# Patient Record
Sex: Female | Born: 1995 | Race: Black or African American | Hispanic: No | Marital: Single | Smoking: Never smoker
Health system: Southern US, Community
[De-identification: ages and names within clinical notes are randomized; demographics above are authoritative.]

## PROBLEM LIST (undated history)

## (undated) ENCOUNTER — Inpatient Hospital Stay (HOSPITAL_COMMUNITY): Payer: Self-pay

## (undated) ENCOUNTER — Inpatient Hospital Stay (HOSPITAL_COMMUNITY): Payer: Managed Care, Other (non HMO)

## (undated) DIAGNOSIS — J45909 Unspecified asthma, uncomplicated: Secondary | ICD-10-CM

## (undated) DIAGNOSIS — J189 Pneumonia, unspecified organism: Secondary | ICD-10-CM

## (undated) DIAGNOSIS — Z332 Encounter for elective termination of pregnancy: Secondary | ICD-10-CM

## (undated) HISTORY — PX: MULTIPLE TOOTH EXTRACTIONS: SHX2053

---

## 2015-04-08 HISTORY — PX: INDUCED ABORTION: SHX677

## 2017-04-30 ENCOUNTER — Ambulatory Visit (INDEPENDENT_AMBULATORY_CARE_PROVIDER_SITE_OTHER): Payer: Managed Care, Other (non HMO) | Admitting: Student

## 2017-04-30 ENCOUNTER — Encounter: Payer: Self-pay | Admitting: Student

## 2017-04-30 DIAGNOSIS — Z124 Encounter for screening for malignant neoplasm of cervix: Secondary | ICD-10-CM

## 2017-04-30 DIAGNOSIS — Z23 Encounter for immunization: Secondary | ICD-10-CM | POA: Diagnosis not present

## 2017-04-30 DIAGNOSIS — O210 Mild hyperemesis gravidarum: Secondary | ICD-10-CM

## 2017-04-30 DIAGNOSIS — Z3481 Encounter for supervision of other normal pregnancy, first trimester: Secondary | ICD-10-CM

## 2017-04-30 DIAGNOSIS — O0990 Supervision of high risk pregnancy, unspecified, unspecified trimester: Secondary | ICD-10-CM | POA: Insufficient documentation

## 2017-04-30 DIAGNOSIS — Z3480 Encounter for supervision of other normal pregnancy, unspecified trimester: Secondary | ICD-10-CM

## 2017-04-30 LAB — POCT URINALYSIS DIP (DEVICE)
Bilirubin Urine: NEGATIVE
Glucose, UA: NEGATIVE mg/dL
Hgb urine dipstick: NEGATIVE
Ketones, ur: NEGATIVE mg/dL
Leukocytes, UA: NEGATIVE
Nitrite: NEGATIVE
PH: 7.5 (ref 5.0–8.0)
Protein, ur: NEGATIVE mg/dL
Specific Gravity, Urine: 1.015 (ref 1.005–1.030)
Urobilinogen, UA: 0.2 mg/dL (ref 0.0–1.0)

## 2017-04-30 NOTE — Patient Instructions (Signed)

## 2017-05-01 DIAGNOSIS — O210 Mild hyperemesis gravidarum: Secondary | ICD-10-CM | POA: Insufficient documentation

## 2017-05-01 LAB — CYTOLOGY - PAP: Diagnosis: NEGATIVE

## 2017-05-01 MED ORDER — ONDANSETRON HCL 8 MG PO TABS: 8.0000 mg | ORAL_TABLET | Freq: Three times a day (TID) | ORAL | 1 refills | Status: DC | PRN

## 2017-05-01 NOTE — Progress Notes (Signed)
  Subjective:    Leslie Thomas is being seen today for her first obstetrical visit.  This is not a planned pregnancy. She is at 5039w1d gestation. Her obstetrical history is significant for nothing. . Relationship with FOB: significant other, not living together. Patient does not intend to breast feed. Pregnancy history fully reviewed.  Patient reports no complaints.  Review of Systems:   Review of Systems  Constitutional: Negative.   HENT: Negative.   Respiratory: Negative.   Cardiovascular: Negative.   Genitourinary: Negative.   Neurological: Negative.     Objective:     BP 114/61   Pulse 85   Ht 5' (1.524 m)   Wt 121 lb 3.2 oz (55 kg)   LMP 01/29/2017 (Within Days)   BMI 23.67 kg/m  Physical Exam  Constitutional: She is oriented to person, place, and time. She appears well-developed.  HENT:  Head: Normocephalic.  Eyes: Pupils are equal, round, and reactive to light.  Neck: Normal range of motion.  Respiratory: Effort normal.  GI: Soft.  Genitourinary: Vagina normal. No vaginal discharge found.  Neurological: She is alert and oriented to person, place, and time.  Skin: Skin is warm and dry.    Maternal Exam:  Introitus: Vagina is negative for discharge.       Assessment:    Pregnancy: G2P0010 Patient Active Problem List   Diagnosis Date Noted  . Supervision of other normal pregnancy, antepartum 04/30/2017       Plan:     Initial labs drawn. Prenatal vitamins. Problem list reviewed and updated. AFP3 discussed: declined. Role of ultrasound in pregnancy discussed; fetal survey: ordered. Amniocentesis discussed: not indicated. Follow up in 8 weeks. 50% of 30 min visit spent on counseling and coordination of care.  -Oriented patient to practice, students, residents -Reviewed warning signs and when to come to MAU.  -Pap smear today -Anatomy scan ordered  Leslie Thomas 05/01/2017

## 2017-05-02 LAB — URINE CULTURE, OB REFLEX

## 2017-05-02 LAB — CULTURE, OB URINE

## 2017-05-04 LAB — HEMOGLOBINOPATHY EVALUATION
Ferritin: 204 ng/mL — ABNORMAL HIGH (ref 15–150)
HGB A2 QUANT: 2.1 % (ref 1.8–3.2)
HGB A: 97.9 % (ref 96.4–98.8)
HGB C: 0 %
HGB F QUANT: 0 % (ref 0.0–2.0)
HGB S: 0 %
Hgb Solubility: NEGATIVE
Hgb Variant: 0 %

## 2017-05-04 LAB — OBSTETRIC PANEL, INCLUDING HIV
Antibody Screen: NEGATIVE
BASOS ABS: 0.1 10*3/uL (ref 0.0–0.2)
Basos: 0 %
EOS (ABSOLUTE): 0.4 10*3/uL (ref 0.0–0.4)
EOS: 2 %
HEMOGLOBIN: 12.2 g/dL (ref 11.1–15.9)
HEP B S AG: NEGATIVE
HIV Screen 4th Generation wRfx: NONREACTIVE
Hematocrit: 35.9 % (ref 34.0–46.6)
IMMATURE GRANULOCYTES: 0 %
Immature Grans (Abs): 0 10*3/uL (ref 0.0–0.1)
Lymphocytes Absolute: 2.6 10*3/uL (ref 0.7–3.1)
Lymphs: 15 %
MCH: 28.2 pg (ref 26.6–33.0)
MCHC: 34 g/dL (ref 31.5–35.7)
MCV: 83 fL (ref 79–97)
MONOCYTES: 6 %
Monocytes Absolute: 1 10*3/uL — ABNORMAL HIGH (ref 0.1–0.9)
NEUTROS ABS: 12.9 10*3/uL — AB (ref 1.4–7.0)
NEUTROS PCT: 77 %
PLATELETS: 335 10*3/uL (ref 150–379)
RBC: 4.33 x10E6/uL (ref 3.77–5.28)
RDW: 12.9 % (ref 12.3–15.4)
RH TYPE: POSITIVE
RPR: NONREACTIVE
RUBELLA: 3.84 {index} (ref >0.99)
WBC: 17 10*3/uL — ABNORMAL HIGH (ref 3.4–10.8)

## 2017-05-05 ENCOUNTER — Encounter: Payer: Self-pay | Admitting: *Deleted

## 2017-05-08 LAB — SMN1 COPY NUMBER ANALYSIS (SMA CARRIER SCREENING)

## 2017-05-31 DIAGNOSIS — Z3480 Encounter for supervision of other normal pregnancy, unspecified trimester: Secondary | ICD-10-CM

## 2017-06-11 ENCOUNTER — Other Ambulatory Visit: Payer: Self-pay | Admitting: Obstetrics and Gynecology

## 2017-06-11 ENCOUNTER — Other Ambulatory Visit: Payer: Self-pay | Admitting: Student

## 2017-06-11 ENCOUNTER — Encounter: Payer: Self-pay | Admitting: Student

## 2017-06-11 ENCOUNTER — Encounter (HOSPITAL_COMMUNITY): Payer: Self-pay | Admitting: *Deleted

## 2017-06-11 ENCOUNTER — Other Ambulatory Visit: Payer: Self-pay

## 2017-06-11 ENCOUNTER — Ambulatory Visit (HOSPITAL_COMMUNITY)
Admission: RE | Admit: 2017-06-11 | Discharge: 2017-06-11 | Disposition: A | Discharge status: Home / Self Care | Payer: Managed Care, Other (non HMO) | Source: Ambulatory Visit | Attending: Student | Admitting: Student

## 2017-06-11 ENCOUNTER — Telehealth: Payer: Self-pay | Admitting: Obstetrics and Gynecology

## 2017-06-11 DIAGNOSIS — O26872 Cervical shortening, second trimester: Secondary | ICD-10-CM | POA: Diagnosis not present

## 2017-06-11 DIAGNOSIS — Z363 Encounter for antenatal screening for malformations: Secondary | ICD-10-CM | POA: Diagnosis not present

## 2017-06-11 DIAGNOSIS — N883 Incompetence of cervix uteri: Secondary | ICD-10-CM | POA: Insufficient documentation

## 2017-06-11 DIAGNOSIS — Z3A19 19 weeks gestation of pregnancy: Secondary | ICD-10-CM

## 2017-06-11 DIAGNOSIS — Z369 Encounter for antenatal screening, unspecified: Secondary | ICD-10-CM

## 2017-06-11 DIAGNOSIS — Z3482 Encounter for supervision of other normal pregnancy, second trimester: Secondary | ICD-10-CM | POA: Insufficient documentation

## 2017-06-11 DIAGNOSIS — Z3480 Encounter for supervision of other normal pregnancy, unspecified trimester: Secondary | ICD-10-CM

## 2017-06-11 DIAGNOSIS — Z3A17 17 weeks gestation of pregnancy: Secondary | ICD-10-CM | POA: Diagnosis not present

## 2017-06-11 NOTE — Addendum Note (Signed)
Encounter addended by: Lenise ArenaBazemore, Betti Goodenow, RDMS on: 06/11/2017 4:33 PM  Actions taken: Imaging Exam ended

## 2017-06-11 NOTE — Telephone Encounter (Signed)
OB Note  Dr. Debroah LoopArnold or I don't feel comfortable placing the rescue cerclage but case d/w Dr. Shawnie PonsPratt, and she is willing to do it on 3/9. Posted with the OR for 0930 on 3/9. Pt [facility] and [facility] and to be NPO after midnight. Also advised patient for pelvic rest and MAU precautions given.  Cornelia Copaharlie Torrin Crihfield, Jr MD Attending Center for [facility] 06/11/2017 Time: [phone]

## 2017-06-12 ENCOUNTER — Encounter (HOSPITAL_COMMUNITY): Payer: Self-pay | Admitting: *Deleted

## 2017-06-12 ENCOUNTER — Other Ambulatory Visit: Payer: Self-pay

## 2017-06-13 ENCOUNTER — Inpatient Hospital Stay (HOSPITAL_COMMUNITY): Payer: Managed Care, Other (non HMO) | Admitting: Anesthesiology

## 2017-06-13 ENCOUNTER — Inpatient Hospital Stay (HOSPITAL_COMMUNITY)
Admission: RE | Admit: 2017-06-13 | Payer: Managed Care, Other (non HMO) | Source: Ambulatory Visit | Admitting: Family Medicine

## 2017-06-13 ENCOUNTER — Ambulatory Visit (HOSPITAL_COMMUNITY)
Admission: AD | Admit: 2017-06-13 | Discharge: 2017-06-13 | Disposition: A | Discharge status: Home / Self Care | Payer: Managed Care, Other (non HMO) | Source: Ambulatory Visit | Attending: Family Medicine | Admitting: Family Medicine

## 2017-06-13 ENCOUNTER — Encounter (HOSPITAL_COMMUNITY)
Admission: AD | Disposition: A | Discharge status: Home / Self Care | Payer: Self-pay | Source: Ambulatory Visit | Attending: Family Medicine

## 2017-06-13 ENCOUNTER — Encounter (HOSPITAL_COMMUNITY): Payer: Self-pay | Admitting: *Deleted

## 2017-06-13 DIAGNOSIS — N883 Incompetence of cervix uteri: Secondary | ICD-10-CM | POA: Diagnosis not present

## 2017-06-13 DIAGNOSIS — Z79899 Other long term (current) drug therapy: Secondary | ICD-10-CM | POA: Diagnosis not present

## 2017-06-13 DIAGNOSIS — J45909 Unspecified asthma, uncomplicated: Secondary | ICD-10-CM | POA: Diagnosis not present

## 2017-06-13 DIAGNOSIS — O26892 Other specified pregnancy related conditions, second trimester: Secondary | ICD-10-CM | POA: Insufficient documentation

## 2017-06-13 DIAGNOSIS — Z3A19 19 weeks gestation of pregnancy: Secondary | ICD-10-CM | POA: Insufficient documentation

## 2017-06-13 DIAGNOSIS — O26872 Cervical shortening, second trimester: Secondary | ICD-10-CM | POA: Diagnosis not present

## 2017-06-13 HISTORY — PX: CERVICAL CERCLAGE: SHX1329

## 2017-06-13 HISTORY — DX: Encounter for elective termination of pregnancy: Z33.2

## 2017-06-13 HISTORY — DX: Unspecified asthma, uncomplicated: J45.909

## 2017-06-13 HISTORY — DX: Pneumonia, unspecified organism: J18.9

## 2017-06-13 LAB — CBC
HCT: 32.8 % — ABNORMAL LOW (ref 36.0–46.0)
Hemoglobin: 11.4 g/dL — ABNORMAL LOW (ref 12.0–15.0)
MCH: 28.4 pg (ref 26.0–34.0)
MCHC: 34.8 g/dL (ref 30.0–36.0)
MCV: 81.8 fL (ref 78.0–100.0)
PLATELETS: 234 10*3/uL (ref 150–400)
RBC: 4.01 MIL/uL (ref 3.87–5.11)
RDW: 12.9 % (ref 11.5–15.5)
WBC: 13.6 10*3/uL — AB (ref 4.0–10.5)

## 2017-06-13 LAB — TYPE AND SCREEN
ABO/RH(D): O POS
ANTIBODY SCREEN: NEGATIVE

## 2017-06-13 LAB — ABO/RH: ABO/RH(D): O POS

## 2017-06-13 SURGERY — CERCLAGE, CERVIX, VAGINAL APPROACH
Anesthesia: Spinal | Site: Vagina

## 2017-06-13 MED ORDER — FAMOTIDINE IN NACL 20-0.9 MG/50ML-% IV SOLN
20.0000 mg | Freq: Once | INTRAVENOUS | Status: AC
  Administered 2017-06-13: 20 mg via INTRAVENOUS
  Filled 2017-06-13: qty 50

## 2017-06-13 MED ORDER — INDOMETHACIN 50 MG PO CAPS: 50.0000 mg | ORAL_CAPSULE | Freq: Four times a day (QID) | ORAL | 0 refills | Status: DC

## 2017-06-13 MED ORDER — FENTANYL CITRATE (PF) 100 MCG/2ML IJ SOLN: 25.0000 ug | INTRAMUSCULAR | Status: DC | PRN

## 2017-06-13 MED ORDER — SOD CITRATE-CITRIC ACID 500-334 MG/5ML PO SOLN
30.0000 mL | Freq: Once | ORAL | Status: AC
  Administered 2017-06-13: 30 mL via ORAL
  Filled 2017-06-13: qty 15

## 2017-06-13 MED ORDER — LACTATED RINGERS IV SOLN
INTRAVENOUS | Status: DC
  Administered 2017-06-13 (×2): via INTRAVENOUS

## 2017-06-13 MED ORDER — MEPERIDINE HCL 25 MG/ML IJ SOLN: 6.2500 mg | INTRAMUSCULAR | Status: DC | PRN

## 2017-06-13 MED ORDER — BUPIVACAINE HCL (PF) 0.25 % IJ SOLN
INTRAMUSCULAR | Status: AC
  Filled 2017-06-13: qty 30

## 2017-06-13 MED ORDER — MIDAZOLAM HCL 2 MG/2ML IJ SOLN: 0.5000 mg | Freq: Once | INTRAMUSCULAR | Status: DC | PRN

## 2017-06-13 MED ORDER — CEFAZOLIN SODIUM-DEXTROSE 2-4 GM/100ML-% IV SOLN
2.0000 g | INTRAVENOUS | Status: AC
  Administered 2017-06-13: 2 g via INTRAVENOUS
  Filled 2017-06-13: qty 100

## 2017-06-13 MED ORDER — PROMETHAZINE HCL 25 MG/ML IJ SOLN: 6.2500 mg | INTRAMUSCULAR | Status: DC | PRN

## 2017-06-13 MED ORDER — BUPIVACAINE HCL (PF) 0.25 % IJ SOLN
INTRAMUSCULAR | Status: DC | PRN
  Administered 2017-06-13: 20 mL

## 2017-06-13 SURGICAL SUPPLY — 15 items
CANISTER SUCT 3000ML PPV (MISCELLANEOUS) ×3 IMPLANT
GLOVE BIOGEL PI IND STRL 7.0 (GLOVE) ×2 IMPLANT
GLOVE BIOGEL PI INDICATOR 7.0 (GLOVE) ×4
GLOVE ECLIPSE 7.0 STRL STRAW (GLOVE) ×3 IMPLANT
GOWN STRL REUS W/TWL LRG LVL3 (GOWN DISPOSABLE) ×9 IMPLANT
NEEDLE MAYO CATGUT SZ4 (NEEDLE) ×3 IMPLANT
PACK VAGINAL MINOR WOMEN LF (CUSTOM PROCEDURE TRAY) ×3 IMPLANT
PAD OB MATERNITY 4.3X12.25 (PERSONAL CARE ITEMS) ×3 IMPLANT
PAD PREP 24X48 CUFFED NSTRL (MISCELLANEOUS) ×3 IMPLANT
SUT MERSILENE 5MM BP 1 12 (SUTURE) ×3 IMPLANT
TOWEL OR 17X24 6PK STRL BLUE (TOWEL DISPOSABLE) ×6 IMPLANT
TRAY FOLEY CATH SILVER 14FR (SET/KITS/TRAYS/PACK) ×3 IMPLANT
TUBING NON-CON 1/4 X 20 CONN (TUBING) ×2 IMPLANT
TUBING NON-CON 1/4 X 20' CONN (TUBING) ×1
YANKAUER SUCT BULB TIP NO VENT (SUCTIONS) ×3 IMPLANT

## 2017-06-13 NOTE — MAU Note (Signed)
Pt here for rescue cerclage, pt denies pain or bleeding.

## 2017-06-13 NOTE — Transfer of Care (Signed)
Immediate Anesthesia Transfer of Care Note  Patient: Leslie Thomas  Procedure(s) Performed: CERCLAGE CERVICAL (N/A Vagina )  Patient Location: PACU  Anesthesia Type:Spinal  Level of Consciousness: awake, alert  and oriented  Airway & Oxygen Therapy: Patient Spontanous Breathing  Post-op Assessment: Report given to RN and Post -op Vital signs reviewed and stable  Post vital signs: Reviewed and stable  Last Vitals:  Vitals:   06/13/17 0802  BP: 101/66  Pulse: 87  Resp: 16  Temp: 36.9 C  SpO2: 100%    Last Pain:  Vitals:   06/13/17 0802  TempSrc: Oral         Complications: No apparent anesthesia complications

## 2017-06-13 NOTE — Anesthesia Postprocedure Evaluation (Signed)
Anesthesia Post Note  Patient: Leslie Thomas  Procedure(s) Performed: CERCLAGE CERVICAL (N/A Vagina )     Patient location during evaluation: PACU Anesthesia Type: Spinal Level of consciousness: awake and alert, patient cooperative and oriented Pain management: pain level controlled Vital Signs Assessment: post-procedure vital signs reviewed and stable Respiratory status: nonlabored ventilation, respiratory function stable and spontaneous breathing Cardiovascular status: blood pressure returned to baseline and stable Postop Assessment: patient able to bend at knees and spinal receding Anesthetic complications: no    Last Vitals:  Vitals:   06/13/17 1100 06/13/17 1115  BP: (!) 93/57 97/61  Pulse: 78 69  Resp: 16 16  Temp:    SpO2: 100% 99%    Last Pain:  Vitals:   06/13/17 1100  TempSrc:   PainSc: 0-No pain   Pain Goal:                 Carles Florea,E. Blaire Hodsdon

## 2017-06-13 NOTE — Anesthesia Preprocedure Evaluation (Signed)
Anesthesia Evaluation  Patient identified by MRN, date of birth, ID band Patient awake    Reviewed: Allergy & Precautions, NPO status , Patient's Chart, lab work & pertinent test results  History of Anesthesia Complications Negative for: history of anesthetic complications  Airway Mallampati: II  TM Distance: >3 FB Neck ROM: Full    Dental  (+) Dental Advisory Given   Pulmonary asthma (no inhaler needed in years) ,    breath sounds clear to auscultation       Cardiovascular negative cardio ROS   Rhythm:Regular Rate:Normal     Neuro/Psych negative neurological ROS     GI/Hepatic negative GI ROS, Neg liver ROS,   Endo/Other  negative endocrine ROS  Renal/GU negative Renal ROS     Musculoskeletal   Abdominal   Peds  Hematology negative hematology ROS (+)   Anesthesia Other Findings   Reproductive/Obstetrics (+) Pregnancy                             Anesthesia Physical Anesthesia Plan  ASA: II  Anesthesia Plan: Spinal   Post-op Pain Management:    Induction:   PONV Risk Score and Plan: 2 and Ondansetron and Treatment may vary due to age or medical condition  Airway Management Planned: Natural Airway  Additional Equipment:   Intra-op Plan:   Post-operative Plan:   Informed Consent: I have reviewed the patients History and Physical, chart, labs and discussed the procedure including the risks, benefits and alternatives for the proposed anesthesia with the patient or authorized representative who has indicated his/her understanding and acceptance.   Dental advisory given  Plan Discussed with: CRNA and Surgeon  Anesthesia Plan Comments: (Plan routine monitors, SAB)        Anesthesia Quick Evaluation

## 2017-06-13 NOTE — Op Note (Signed)
Preoperative diagnosis: shortened cervix   Postoperative diagnosis: Same  Procedure: Cervical cerclage  Surgeon: Leslie Thomas, M.D.  Anesthesia: spinal - Leslie Benarswell Jackson, MD Paracervical block - Leslie Thomas  Findings: soft closed cervis  Estimated blood loss: 25 cc   Specimens: None  Reason for procedure: Leslie DollyLauren Thomas G2P0010 317w2d, found incidentally to have a shortened cervix on anatomy u/s  Procedure: Patient was taken to the operating room where spinal analgesia was administered. She was prepped and draped in the usual sterile fashion.  A Foley catheter is used to drain her bladder. A timeout was performed. The patient had SCDs in place.The patient was in dorsal lithotomy.  A weighted speculum was placed inside the vagina. Paracervical block with 0.25% Marcaine performed. A Deaver was used anteriorly. The cervix was grasped with an open ring  forcep. A 5 mm Mersilene band on a cutting needle was used and to put in a pursestring suture. This was started at 12:00 and exiting at 9:00, starting at 9:00 and exiting at 6:00, starting at 6:00 and exiting at 3:00, starting at 3:00 and exiting at 12:00. A suture was then tied down. All instrument, needle and lap counts were correct x 2. The patient was taken to recovery in stable condition.  Leslie Proctoranya S PrattMD 06/13/2017 11:05 AM

## 2017-06-13 NOTE — Discharge Instructions (Signed)
Cervical Cerclage Cervical cerclage is a surgical procedure to correct a cervix that opens up and thins out before pregnancy is at term (cervical insufficiency, also called incompetent cervix). This condition can cause labor to start early (prematurely). This procedure involves using stitches to sew the cervix shut during pregnancy. Your surgeon may use ultrasound equipment to help guide the procedure and monitor your baby. Ultrasound equipment uses sound waves to take images of your cervix and uterus. Your surgeon will assess these images on a monitor in the operating room. Tell a health care provider about:  Any allergies you have, especially any allergies related to prescribed medicine, stitches, or anesthetic medicines.  All medicines you are taking, including vitamins, herbs, eye drops, creams, and over-the-counter medicines. Bring a list of all of your medicines to your appointment.  Your medical history, including prior labor deliveries.  Any problems you or family members have had with anesthetic medicines.  Any blood disorders you have.  Any surgeries you have had, including prior cervical stitching.  Any medical conditions you have.  Whether you are pregnant or may be pregnant. What are the risks? Generally, this is a safe procedure. However, problems may occur, including:  Infection, such as infection of the cervix or amniotic sac.  Vaginal bleeding.  Allergic reactions to medicines.  Damage to other structures or organs, such as tearing (rupture) of membranes or cervical laceration.  Premature contractions including going into early labor and delivery.  Cervical dystocia, which occurs when the cervix is unable to dilate normally during labor.  What happens before the procedure? Staying hydrated Follow instructions from your health care provider about hydration, which may include:  Up to 2 hours before the procedure - you may continue to drink clear liquids, such as  water, clear fruit juice, black coffee, and plain tea.  Eating and drinking restrictions Follow instructions from your health care provider about eating and drinking, which may include:  8 hours before the procedure - stop eating heavy meals or foods such as meat, fried foods, or fatty foods.  6 hours before the procedure - stop eating light meals or foods, such as toast or cereal.  6 hours before the procedure - stop drinking milk or drinks that contain milk.  2 hours before the procedure - stop drinking clear liquids.  Medicines  Ask your health care provider about: ? Changing or stopping your regular medicines. This is especially important if you are taking diabetes medicines or blood thinners. ? Taking medicines such as aspirin and ibuprofen. These medicines can thin your blood. Do not take these medicines before your procedure if your health care provider instructs you not to.  You may be given antibiotic medicine to help prevent infection. General instructions  Do not put on any lotion, deodorant, or perfume.  Remove contact lenses and jewelry.  Ask your health care provider how your surgical site will be marked or identified.  You may have an exam or testing.  You may have a blood or urine sample taken.  Plan to have someone take you home from the hospital or clinic.  If you will be going home right after the procedure, plan to have someone with you for 24 hours. What happens during the procedure?  To reduce your risk of infection: ? Your health care team will wash or sanitize their hands. ? Your skin will be washed with soap.  An IV tube will be inserted into one of your veins.  You may be given   one or more of the following: ? A medicine to help you relax (sedative). ? A medicine to numb the area (local anesthetic). ? A medicine to make you fall asleep (general anesthetic). ? A medicine that is injected into your spine to numb the area below and slightly above  the injection site (spinal anesthetic). ? A medicine that is injected into an area of your body to numb everything below the injection site (regional anesthetic).  A lubricated instrument (speculum) will be inserted into your vagina. The speculum will be widened to open the walls of your vagina so your surgeon can see your cervix.  Your cervix will be grasped and tightly stitched closed (sutured). To do this, your surgeon will stitch a strong band of thread around your cervix, then the thread will be tightened to hold your cervix shut. The procedure may vary among health care providers and hospitals. What happens after the procedure?  Your blood pressure, heart rate, breathing rate, and blood oxygen level will be monitored until the medicines you were given have worn off. You will be monitored for premature contractions.  You may have light bleeding and mild cramping.  You may have to wear compression stockings. These stockings help to prevent blood clots and reduce swelling in your legs.  Do not drive for 24 hours if you received a sedative.  You may be put on bed rest.  You may be given medicine to prevent infection.  You may be given an injection of a hormone (progesterone) to prevent your uterus from tightening (contracting). Summary  Cervical cerclage is a surgical procedure that involves using stitches to sew the cervix shut during pregnancy.  Your blood pressure, heart rate, breathing rate, and blood oxygen level will be monitored until the medicines you were given have worn off. You will be monitored for premature contractions.  You may need to be on bed rest after the procedure.  Plan to have someone take you home from the hospital or clinic. This information is not intended to replace advice given to you by your health care provider. Make sure you discuss any questions you have with your health care provider. Document Released: 03/06/2008 Document Revised: 11/16/2015  Document Reviewed: 11/08/2015 Elsevier Interactive Patient Education  2018 Elsevier Inc.  

## 2017-06-13 NOTE — Interval H&P Note (Signed)
History and Physical Interval Note:  06/13/2017 9:05 AM  Leslie Thomas  has presented today for surgery, with the diagnosis of incompetent cervix, [redacted] weeks gestation  The various methods of treatment have been discussed with the patient and family. After consideration of risks, benefits and other options for treatment, the patient has consented to  Procedure(s): CERCLAGE CERVICAL (N/A) as a surgical intervention .  The patient's history has been reviewed, patient examined, no change in status, stable for surgery.  I have reviewed the patient's chart and labs.  Questions were answered to the patient's satisfaction.     Reva Boresanya S Ari Engelbrecht

## 2017-06-13 NOTE — H&P (Signed)
Leslie Thomas is an 22 y.o. 802P0010 female.   Chief Complaint: shortened cervix HPI: Found to incidentally have a shortened cervix on anatomy u/s. Elected for rescue cerclage.  Past Medical History:  Diagnosis Date  . Asthma    seasonal - rarely uses inhaler  . Pneumonia    6th grade  . Termination of pregnancy (fetus)    x 1 in clinic     Past Surgical History:  Procedure Laterality Date  . MULTIPLE TOOTH EXTRACTIONS     general anesthesia    Family History  Problem Relation Age of Onset  . Asthma Mother    Social History:  reports that  has never smoked. she has never used smokeless tobacco. She reports that she does not drink alcohol or use drugs.  Allergies: Not on File  Medications Prior to Admission  Medication Sig Dispense Refill  . albuterol (PROVENTIL HFA;VENTOLIN HFA) 108 (90 Base) MCG/ACT inhaler Inhale 2 puffs into the lungs every 6 (six) hours as needed for wheezing or shortness of breath.     . ondansetron (ZOFRAN) 8 MG tablet Take 1 tablet (8 mg total) by mouth every 8 (eight) hours as needed for nausea or vomiting. (Patient not taking: Reported on 06/12/2017) 30 tablet 1  . Prenatal Multivit-Min-Fe-FA (PRENATAL VITAMINS PO) Take 1 tablet by mouth daily.       A comprehensive review of systems was negative.  Last menstrual period 01/29/2017. General appearance: alert, cooperative and appears stated age Head: Normocephalic, without obvious abnormality, atraumatic Neck: supple, symmetrical, trachea midline Lungs: normal effort Heart: regular rate and rhythm Abdomen: soft, non-tender; bowel sounds normal; no masses,  no organomegaly Extremities: Homans sign is negative, no sign of DVT Skin: Skin color, texture, turgor normal. No rashes or lesions Neurologic: Grossly normal   Lab Results  Component Value Date   WBC 17.0 (H) 04/30/2017   HGB 12.2 04/30/2017   HCT 35.9 04/30/2017   MCV 83 04/30/2017   PLT 335 04/30/2017    Assessment/Plan Principal  Problem:   Short cervix  For rescue cerclage Risks include but are not limited to bleeding, infection, injury to surrounding structures, including bowel, bladder and ureters, blood clots, and death.  Likelihood of success is high.    Leslie Thomas 06/13/2017, 7:52 AM

## 2017-06-14 ENCOUNTER — Encounter (HOSPITAL_COMMUNITY): Payer: Self-pay | Admitting: Family Medicine

## 2017-06-16 ENCOUNTER — Telehealth: Payer: Self-pay | Admitting: *Deleted

## 2017-06-16 ENCOUNTER — Encounter: Payer: Self-pay | Admitting: Family Medicine

## 2017-06-16 NOTE — Telephone Encounter (Addendum)
Pt left voice mail on 3/11 stating that she had surgery on 3/9 for rescue cerclage. She is requesting a return to work Physicist, medicalletter. Please call back.   3/14  0915  Called pt and discussed her request. 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 I advised that she is not be permitted to return to work @ this time. She will most likely stay out of work for the remainder of her pregnancy. Her next office appt is 3/21 @ 1415 and she should discuss this with the doctor. We can complete FMLA or disability paperwork for her job if desired.  Pt voiced understanding and had no questions.

## 2017-06-17 ENCOUNTER — Encounter: Payer: Self-pay | Admitting: Family Medicine

## 2017-06-22 ENCOUNTER — Encounter: Payer: Self-pay | Admitting: Family Medicine

## 2017-06-23 ENCOUNTER — Telehealth: Payer: Self-pay

## 2017-06-23 NOTE — Telephone Encounter (Signed)
Pt called and stated that she wanted to speak with Chase Callerarrie H. About a message in regards to a return to work letter.

## 2017-06-25 ENCOUNTER — Encounter: Payer: Managed Care, Other (non HMO) | Admitting: Student

## 2017-06-25 ENCOUNTER — Ambulatory Visit (INDEPENDENT_AMBULATORY_CARE_PROVIDER_SITE_OTHER): Payer: Managed Care, Other (non HMO) | Admitting: Family Medicine

## 2017-06-25 ENCOUNTER — Encounter: Payer: Self-pay | Admitting: *Deleted

## 2017-06-25 VITALS — BP 103/63 | HR 93 | Wt 124.0 lb

## 2017-06-25 DIAGNOSIS — O3430 Maternal care for cervical incompetence, unspecified trimester: Secondary | ICD-10-CM

## 2017-06-25 DIAGNOSIS — O3432 Maternal care for cervical incompetence, second trimester: Secondary | ICD-10-CM | POA: Insufficient documentation

## 2017-06-25 DIAGNOSIS — Z3480 Encounter for supervision of other normal pregnancy, unspecified trimester: Secondary | ICD-10-CM

## 2017-06-25 DIAGNOSIS — N883 Incompetence of cervix uteri: Secondary | ICD-10-CM

## 2017-06-25 NOTE — Progress Notes (Signed)
S/P rescue cerclage on 3/9.

## 2017-06-25 NOTE — Progress Notes (Signed)
   PRENATAL VISIT NOTE  Subjective:  Leslie Thomas is a [age] y.o. G2P0010 at 3933w6d being seen today for ongoing prenatal care.  She is currently monitored for the following issues for this high-risk pregnancy and has Supervision of other normal pregnancy, antepartum; Morning sickness; and Short cervix on their problem list.  Patient reports no complaints.  Contractions: Not present. Vag. Bleeding: None.  Movement: Present. Denies leaking of fluid.   The following portions of the patient's history were reviewed and updated as appropriate: allergies, current medications, past family history, past medical history, past social history, past surgical history and problem list. Problem list updated.  Objective:   Vitals:   06/25/17 1420  BP: 103/63  Pulse: 93  Weight: 124 lb (56.2 kg)    Fetal Status: Fetal Heart Rate (bpm): 150 Fundal Height: 18 cm Movement: Present     General:  Alert, oriented and cooperative. Patient is in no acute distress.  Skin: Skin is warm and dry. No rash noted.   Cardiovascular: Normal heart rate noted  Respiratory: Normal respiratory effort, no problems with respiration noted  Abdomen: Soft, gravid, appropriate for gestational age.  Pain/Pressure: Absent     Pelvic: Cervical exam deferred        Extremities: Normal range of motion.  Edema: None  Mental Status:  Normal mood and affect. Normal behavior. Normal judgment and thought content.   Assessment and Plan:  Pregnancy: G2P0010 at 5933w6d  1. Supervision of other normal pregnancy, antepartum FHT and FH normal - US MFM OB FOLLOW UP; Future  2. Short cervix Cerclage in place. Will get US for CL. - US MFM OB FOLLOW UP; Future  Preterm labor symptoms and general obstetric precautions including but not limited to vaginal bleeding, contractions, leaking of fluid and fetal movement were reviewed in detail with the patient. Please refer to After Visit Summary for other counseling recommendations.  Return in about  1 month (around 07/23/2017).   Levie HeritageJacob J Stinson, DO

## 2017-06-30 ENCOUNTER — Encounter: Payer: Self-pay | Admitting: Family Medicine

## 2017-06-30 ENCOUNTER — Ambulatory Visit (HOSPITAL_COMMUNITY)
Admission: RE | Admit: 2017-06-30 | Discharge: 2017-06-30 | Disposition: A | Discharge status: Home / Self Care | Payer: Managed Care, Other (non HMO) | Source: Ambulatory Visit | Attending: Family Medicine | Admitting: Family Medicine

## 2017-06-30 ENCOUNTER — Telehealth: Payer: Self-pay | Admitting: *Deleted

## 2017-06-30 DIAGNOSIS — O3432 Maternal care for cervical incompetence, second trimester: Secondary | ICD-10-CM | POA: Insufficient documentation

## 2017-06-30 DIAGNOSIS — O3430 Maternal care for cervical incompetence, unspecified trimester: Secondary | ICD-10-CM

## 2017-06-30 DIAGNOSIS — Z0489 Encounter for examination and observation for other specified reasons: Secondary | ICD-10-CM | POA: Insufficient documentation

## 2017-06-30 DIAGNOSIS — Z3686 Encounter for antenatal screening for cervical length: Secondary | ICD-10-CM | POA: Diagnosis present

## 2017-06-30 DIAGNOSIS — IMO0002 Reserved for concepts with insufficient information to code with codable children: Secondary | ICD-10-CM | POA: Insufficient documentation

## 2017-06-30 DIAGNOSIS — Z3A20 20 weeks gestation of pregnancy: Secondary | ICD-10-CM | POA: Insufficient documentation

## 2017-06-30 DIAGNOSIS — Z3480 Encounter for supervision of other normal pregnancy, unspecified trimester: Secondary | ICD-10-CM

## 2017-06-30 DIAGNOSIS — Z362 Encounter for other antenatal screening follow-up: Secondary | ICD-10-CM | POA: Insufficient documentation

## 2017-06-30 DIAGNOSIS — O321XX0 Maternal care for breech presentation, not applicable or unspecified: Secondary | ICD-10-CM | POA: Diagnosis not present

## 2017-06-30 DIAGNOSIS — O26872 Cervical shortening, second trimester: Secondary | ICD-10-CM | POA: Diagnosis not present

## 2017-06-30 DIAGNOSIS — N883 Incompetence of cervix uteri: Secondary | ICD-10-CM

## 2017-06-30 NOTE — Telephone Encounter (Signed)
Patient left message on line. Stated she had an u/s today. Wants to know if she will be able to return to work. Please call. Routed to Dr Adrian BlackwaterStinson.

## 2017-07-01 ENCOUNTER — Encounter: Payer: Self-pay | Admitting: *Deleted

## 2017-07-01 MED ORDER — PROGESTERONE MICRONIZED 200 MG PO CAPS: ORAL_CAPSULE | ORAL | 3 refills | Status: AC

## 2017-07-01 NOTE — Telephone Encounter (Addendum)
MFM recommended her being on prometrium for short cervix, which I prescribed. She can return to work - I would recommend light duty, if her work has that available for her.

## 2017-07-01 NOTE — Addendum Note (Signed)
Addended by: Levie HeritageSTINSON, JACOB J on: 07/01/2017 07:04 AM   Modules accepted: Orders

## 2017-07-01 NOTE — Progress Notes (Signed)
Patient came to clinic to ask about restrictions for her pregnancy. S/p cerclage placement for shortened cervix. Reviewed case with Dr Earlene Plateravis who stated that as long as she has a job where she sits most of the time she could continue to work. She cannot be on her feet for extended times during the day and she cannot lift heavy weights. 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 She feels like they will give her a sit down job as long as she has the proper documentation. She thanked me for my assistance in this matter.

## 2017-07-06 ENCOUNTER — Encounter: Payer: Self-pay | Admitting: Family Medicine

## 2017-07-06 ENCOUNTER — Other Ambulatory Visit: Payer: Self-pay | Admitting: General Practice

## 2017-07-06 DIAGNOSIS — O3432 Maternal care for cervical incompetence, second trimester: Secondary | ICD-10-CM

## 2017-07-07 ENCOUNTER — Encounter: Payer: Self-pay | Admitting: Family Medicine

## 2017-07-15 ENCOUNTER — Encounter: Payer: Self-pay | Admitting: *Deleted

## 2017-07-21 ENCOUNTER — Ambulatory Visit (INDEPENDENT_AMBULATORY_CARE_PROVIDER_SITE_OTHER): Payer: Managed Care, Other (non HMO) | Admitting: Family Medicine

## 2017-07-21 ENCOUNTER — Other Ambulatory Visit: Payer: Self-pay | Admitting: Family Medicine

## 2017-07-21 ENCOUNTER — Ambulatory Visit (HOSPITAL_COMMUNITY)
Admission: RE | Admit: 2017-07-21 | Discharge: 2017-07-21 | Disposition: A | Discharge status: Home / Self Care | Payer: Managed Care, Other (non HMO) | Source: Ambulatory Visit | Attending: Family Medicine | Admitting: Family Medicine

## 2017-07-21 VITALS — BP 106/69 | HR 80 | Wt 130.8 lb

## 2017-07-21 DIAGNOSIS — O09892 Supervision of other high risk pregnancies, second trimester: Secondary | ICD-10-CM | POA: Diagnosis not present

## 2017-07-21 DIAGNOSIS — Z3A23 23 weeks gestation of pregnancy: Secondary | ICD-10-CM | POA: Insufficient documentation

## 2017-07-21 DIAGNOSIS — Z3686 Encounter for antenatal screening for cervical length: Secondary | ICD-10-CM | POA: Insufficient documentation

## 2017-07-21 DIAGNOSIS — O3432 Maternal care for cervical incompetence, second trimester: Secondary | ICD-10-CM

## 2017-07-21 DIAGNOSIS — O26892 Other specified pregnancy related conditions, second trimester: Secondary | ICD-10-CM | POA: Insufficient documentation

## 2017-07-21 DIAGNOSIS — N883 Incompetence of cervix uteri: Secondary | ICD-10-CM

## 2017-07-21 DIAGNOSIS — L309 Dermatitis, unspecified: Secondary | ICD-10-CM

## 2017-07-21 DIAGNOSIS — O0990 Supervision of high risk pregnancy, unspecified, unspecified trimester: Secondary | ICD-10-CM

## 2017-07-21 MED ORDER — TRIAMCINOLONE ACETONIDE 0.025 % EX OINT: 1.0000 "application " | TOPICAL_OINTMENT | Freq: Two times a day (BID) | CUTANEOUS | 0 refills | Status: DC

## 2017-07-21 NOTE — Progress Notes (Addendum)
   PRENATAL VISIT NOTE  Subjective:  Leslie Thomas is a 22 y.o. G2P0010 at 8950w4d being seen today for ongoing prenatal care.  She is currently monitored for the following issues for this high-risk pregnancy and has Supervision of high risk pregnancy, antepartum; Morning sickness; Short cervix; Cervical insufficiency during pregnancy in second trimester, antepartum; and Evaluate anatomy not seen on prior sonogram on their problem list.  Patient reports no complaints, other the eczema flare up.  Contractions: Not present. Vag. Bleeding: None.  Movement: Present. Denies leaking of fluid.   The following portions of the patient's history were reviewed and updated as appropriate: allergies, current medications, past family history, past medical history, past social history, past surgical history and problem list. Problem list updated.  Objective:   Vitals:   07/21/17 0830  BP: 106/69  Pulse: 80  Weight: 130 lb 12.8 oz (59.3 kg)    Fetal Status: Fetal Heart Rate (bpm): 145   Movement: Present     General:  Alert, oriented and cooperative. Patient is in no acute distress.  Skin: Skin is warm and dry. No rash noted.   Cardiovascular: Normal heart rate noted  Respiratory: Normal respiratory effort, no problems with respiration noted  Abdomen: Soft, gravid, appropriate for gestational age.  Pain/Pressure: Absent     Pelvic: Cervical exam deferred        Extremities: Normal range of motion.  Edema: None  Mental Status: Normal mood and affect. Normal behavior. Normal judgment and thought content.   Assessment and Plan:  Pregnancy: G2P0010 at 5550w4d  1. Supervision of high risk pregnancy, antepartum - Doing well - Follow up in 4 weeks with 28-week labs  2. Short cervix - CL U/S today  3. Eczema, unspecified type - triamcinolone (KENALOG) 0.025 % ointment; Apply 1 application topically 2 (two) times daily.  Dispense: 30 g; Refill: 0  Preterm labor symptoms and general obstetric precautions  including but not limited to vaginal bleeding, contractions, leaking of fluid and fetal movement were reviewed in detail with the patient. Please refer to After Visit Summary for other counseling recommendations.  [facility] in about 1 month (around 08/18/2017) [facility].  Future Appointments  Date Time Provider Department Center  07/21/2017 10:30 AM [facility] US 1 [facility]  08/18/2017  8:15 AM Reva BoresPratt, Tanya S, MD [facility]   Frederik PearJulie P Degele, MD

## 2017-07-21 NOTE — Addendum Note (Signed)
Addended by: Frederik PearEGELE, Keino Placencia P on: 07/21/2017 09:42 AM   Modules accepted: Orders

## 2017-07-29 ENCOUNTER — Encounter: Payer: Self-pay | Admitting: Family Medicine

## 2017-07-30 ENCOUNTER — Encounter: Payer: Self-pay | Admitting: *Deleted

## 2017-07-30 ENCOUNTER — Telehealth: Payer: Self-pay | Admitting: *Deleted

## 2017-07-30 DIAGNOSIS — N883 Incompetence of cervix uteri: Secondary | ICD-10-CM

## 2017-07-30 NOTE — Telephone Encounter (Signed)
Called pt in response to MyChart message AV:[email or web address]:scheduling f/u u/s. Pt requesting an early morning appt or after 1215. I contacted mfm to schedule, there was nothing on the 30th as requested so pt was scheduled for 1445 on 5/1. Will send MyChart message to let pt know.

## 2017-08-05 ENCOUNTER — Ambulatory Visit (HOSPITAL_COMMUNITY)
Admission: RE | Admit: 2017-08-05 | Discharge: 2017-08-05 | Disposition: A | Discharge status: Home / Self Care | Payer: Managed Care, Other (non HMO) | Source: Ambulatory Visit | Attending: Obstetrics and Gynecology | Admitting: Obstetrics and Gynecology

## 2017-08-05 ENCOUNTER — Encounter (HOSPITAL_COMMUNITY): Payer: Self-pay

## 2017-08-05 ENCOUNTER — Other Ambulatory Visit: Payer: Self-pay | Admitting: Obstetrics and Gynecology

## 2017-08-05 DIAGNOSIS — O26872 Cervical shortening, second trimester: Secondary | ICD-10-CM | POA: Diagnosis present

## 2017-08-05 DIAGNOSIS — N883 Incompetence of cervix uteri: Secondary | ICD-10-CM

## 2017-08-05 DIAGNOSIS — Z3A25 25 weeks gestation of pregnancy: Secondary | ICD-10-CM

## 2017-08-05 DIAGNOSIS — O0990 Supervision of high risk pregnancy, unspecified, unspecified trimester: Secondary | ICD-10-CM

## 2017-08-05 DIAGNOSIS — Z3686 Encounter for antenatal screening for cervical length: Secondary | ICD-10-CM | POA: Diagnosis not present

## 2017-08-05 NOTE — Addendum Note (Signed)
Encounter addended by: Lenoard Aden, RDMS on: 08/05/2017 3:59 PM  Actions taken: Imaging Exam ended

## 2017-08-07 ENCOUNTER — Encounter: Payer: Self-pay | Admitting: Family Medicine

## 2017-08-10 ENCOUNTER — Other Ambulatory Visit: Payer: Self-pay

## 2017-08-10 MED ORDER — PREPLUS 27-1 MG PO TABS: 1.0000 | ORAL_TABLET | Freq: Every day | ORAL | 5 refills | Status: AC

## 2017-08-10 NOTE — Progress Notes (Signed)
Pt request for refill on prenatal.  Prenatal e-prescribed.

## 2017-08-18 ENCOUNTER — Ambulatory Visit (INDEPENDENT_AMBULATORY_CARE_PROVIDER_SITE_OTHER): Payer: Managed Care, Other (non HMO) | Admitting: Family Medicine

## 2017-08-18 VITALS — BP 105/67 | HR 93 | Wt 132.8 lb

## 2017-08-18 DIAGNOSIS — O3432 Maternal care for cervical incompetence, second trimester: Secondary | ICD-10-CM

## 2017-08-18 DIAGNOSIS — Z23 Encounter for immunization: Secondary | ICD-10-CM

## 2017-08-18 DIAGNOSIS — Z3493 Encounter for supervision of normal pregnancy, unspecified, third trimester: Secondary | ICD-10-CM

## 2017-08-18 NOTE — Patient Instructions (Signed)
Third Trimester of Pregnancy The third trimester is from week 28 through week 40 (months 7 through 9). The third trimester is a time when the unborn baby (fetus) is growing rapidly. At the end of the ninth month, the fetus is about 20 inches in length and weighs 6-10 pounds. Body changes during your third trimester Your body will continue to go through many changes during pregnancy. The changes vary from woman to woman. During the third trimester:  Your weight will continue to increase. You can expect to gain 25-35 pounds (11-16 kg) by the end of the pregnancy.  You may begin to get stretch marks on your hips, abdomen, and breasts.  You may urinate more often because the fetus is moving lower into your pelvis and pressing on your bladder.  You may develop or continue to have heartburn. This is caused by increased hormones that slow down muscles in the digestive tract.  You may develop or continue to have constipation because increased hormones slow digestion and cause the muscles that push waste through your intestines to relax.  You may develop hemorrhoids. These are swollen veins (varicose veins) in the rectum that can itch or be painful.  You may develop swollen, bulging veins (varicose veins) in your legs.  You may have increased body aches in the pelvis, back, or thighs. This is due to weight gain and increased hormones that are relaxing your joints.  You may have changes in your hair. These can include thickening of your hair, rapid growth, and changes in texture. Some women also have hair loss during or after pregnancy, or hair that feels dry or thin. Your hair will most likely return to normal after your baby is born.  Your breasts will continue to grow and they will continue to become tender. A yellow fluid (colostrum) may leak from your breasts. This is the first milk you are producing for your baby.  Your belly button may stick out.  You may notice more swelling in your hands,  face, or ankles.  You may have increased tingling or numbness in your hands, arms, and legs. The skin on your belly may also feel numb.  You may feel short of breath because of your expanding uterus.  You may have more problems sleeping. This can be caused by the size of your belly, increased need to urinate, and an increase in your body's metabolism.  You may notice the fetus "dropping," or moving lower in your abdomen (lightening).  You may have increased vaginal discharge.  You may notice your joints feel loose and you may have pain around your pelvic bone.  What to expect at prenatal visits You will have prenatal exams every 2 weeks until week 36. Then you will have weekly prenatal exams. During a routine prenatal visit:  You will be weighed to make sure you and the baby are growing normally.  Your blood pressure will be taken.  Your abdomen will be measured to track your baby's growth.  The fetal heartbeat will be listened to.  Any test results from the previous visit will be discussed.  You may have a cervical check near your due date to see if your cervix has softened or thinned (effaced).  You will be tested for Group B streptococcus. This happens between 35 and 37 weeks.  Your health care provider may ask you:  What your birth plan is.  How you are feeling.  If you are feeling the baby move.  If you have had   any abnormal symptoms, such as leaking fluid, bleeding, severe headaches, or abdominal cramping.  If you are using any tobacco products, including cigarettes, chewing tobacco, and electronic cigarettes.  If you have any questions.  Other tests or screenings that may be performed during your third trimester include:  Blood tests that check for low iron levels (anemia).  Fetal testing to check the health, activity level, and growth of the fetus. Testing is done if you have certain medical conditions or if there are problems during the  pregnancy.  Nonstress test (NST). This test checks the health of your baby to make sure there are no signs of problems, such as the baby not getting enough oxygen. During this test, a belt is placed around your belly. The baby is made to move, and its heart rate is monitored during movement.  What is false labor? False labor is a condition in which you feel small, irregular tightenings of the muscles in the womb (contractions) that usually go away with rest, changing position, or drinking water. These are called Braxton Hicks contractions. Contractions may last for hours, days, or even weeks before true labor sets in. If contractions come at regular intervals, become more frequent, increase in intensity, or become painful, you should see your health care provider. What are the signs of labor?  Abdominal cramps.  Regular contractions that start at 10 minutes apart and become stronger and more frequent with time.  Contractions that start on the top of the uterus and spread down to the lower abdomen and back.  Increased pelvic pressure and dull back pain.  A watery or bloody mucus discharge that comes from the vagina.  Leaking of amniotic fluid. This is also known as your "water breaking." It could be a slow trickle or a gush. Let your health care provider know if it has a color or strange odor. If you have any of these signs, call your health care provider right away, even if it is before your due date. Follow these instructions at home: Medicines  Follow your health care provider's instructions regarding medicine use. Specific medicines may be either safe or unsafe to take during pregnancy.  Take a prenatal vitamin that contains at least 600 micrograms (mcg) of folic acid.  If you develop constipation, try taking a stool softener if your health care provider approves. Eating and drinking  Eat a balanced diet that includes fresh fruits and vegetables, whole grains, good sources of protein  such as meat, eggs, or tofu, and low-fat dairy. Your health care provider will help you determine the amount of weight gain that is right for you.  Avoid raw meat and uncooked cheese. These carry germs that can cause birth defects in the baby.  If you have low calcium intake from food, talk to your health care provider about whether you should take a daily calcium supplement.  Eat four or five small meals rather than three large meals a day.  Limit foods that are high in fat and processed sugars, such as fried and sweet foods.  To prevent constipation: ? Drink enough fluid to keep your urine clear or pale yellow. ? Eat foods that are high in fiber, such as fresh fruits and vegetables, whole grains, and beans. Activity  Exercise only as directed by your health care provider. Most women can continue their usual exercise routine during pregnancy. Try to exercise for 30 minutes at least 5 days a week. Stop exercising if you experience uterine contractions.  Avoid heavy   lifting.  Do not exercise in extreme heat or humidity, or at high altitudes.  Wear low-heel, comfortable shoes.  Practice good posture.  You may continue to have sex unless your health care provider tells you otherwise. Relieving pain and discomfort  Take frequent breaks and rest with your legs elevated if you have leg cramps or low back pain.  Take warm sitz baths to soothe any pain or discomfort caused by hemorrhoids. Use hemorrhoid cream if your health care provider approves.  Wear a good support bra to prevent discomfort from breast tenderness.  If you develop varicose veins: ? Wear support pantyhose or compression stockings as told by your healthcare provider. ? Elevate your feet for 15 minutes, 3-4 times a day. Prenatal care  Write down your questions. Take them to your prenatal visits.  Keep all your prenatal visits as told by your health care provider. This is important. Safety  Wear your seat belt at  all times when driving.  Make a list of emergency phone numbers, including numbers for family, friends, the hospital, and police and fire departments. General instructions  Avoid cat litter boxes and soil used by cats. These carry germs that can cause birth defects in the baby. If you have a cat, ask someone to clean the litter box for you.  Do not travel far distances unless it is absolutely necessary and only with the approval of your health care provider.  Do not use hot tubs, steam rooms, or saunas.  Do not drink alcohol.  Do not use any products that contain nicotine or tobacco, such as cigarettes and e-cigarettes. If you need help quitting, ask your health care provider.  Do not use any medicinal herbs or unprescribed drugs. These chemicals affect the formation and growth of the baby.  Do not douche or use tampons or scented sanitary pads.  Do not cross your legs for long periods of time.  To prepare for the arrival of your baby: ? Take prenatal classes to understand, practice, and ask questions about labor and delivery. ? Make a trial run to the hospital. ? Visit the hospital and tour the maternity area. ? Arrange for maternity or paternity leave through employers. ? Arrange for family and friends to take care of pets while you are in the hospital. ? Purchase a rear-facing car seat and make sure you know how to install it in your car. ? Pack your hospital bag. ? Prepare the baby's nursery. Make sure to remove all pillows and stuffed animals from the baby's crib to prevent suffocation.  Visit your dentist if you have not gone during your pregnancy. Use a soft toothbrush to brush your teeth and be gentle when you floss. Contact a health care provider if:  You are unsure if you are in labor or if your water has broken.  You become dizzy.  You have mild pelvic cramps, pelvic pressure, or nagging pain in your abdominal area.  You have lower back pain.  You have persistent  nausea, vomiting, or diarrhea.  You have an unusual or bad smelling vaginal discharge.  You have pain when you urinate. Get help right away if:  Your water breaks before 37 weeks.  You have regular contractions less than 5 minutes apart before 37 weeks.  You have a fever.  You are leaking fluid from your vagina.  You have spotting or bleeding from your vagina.  You have severe abdominal pain or cramping.  You have rapid weight loss or weight gain.    You have shortness of breath with chest pain.  You notice sudden or extreme swelling of your face, hands, ankles, feet, or legs.  Your baby makes fewer than 10 movements in 2 hours.  You have severe headaches that do not go away when you take medicine.  You have vision changes. Summary  The third trimester is from week 28 through week 40, months 7 through 9. The third trimester is a time when the unborn baby (fetus) is growing rapidly.  During the third trimester, your discomfort may increase as you and your baby continue to gain weight. You may have abdominal, leg, and back pain, sleeping problems, and an increased need to urinate.  During the third trimester your breasts will keep growing and they will continue to become tender. A yellow fluid (colostrum) may leak from your breasts. This is the first milk you are producing for your baby.  False labor is a condition in which you feel small, irregular tightenings of the muscles in the womb (contractions) that eventually go away. These are called Braxton Hicks contractions. Contractions may last for hours, days, or even weeks before true labor sets in.  Signs of labor can include: abdominal cramps; regular contractions that start at 10 minutes apart and become stronger and more frequent with time; watery or bloody mucus discharge that comes from the vagina; increased pelvic pressure and dull back pain; and leaking of amniotic fluid. This information is not intended to replace advice  given to you by your health care provider. Make sure you discuss any questions you have with your health care provider. Document Released: 03/18/2001 Document Revised: 08/30/2015 Document Reviewed: 05/25/2012 [employer] Interactive Patient Education  2017 [employer]  Breastfeeding Choosing to breastfeed is one of the best decisions you can make for yourself and your baby. A change in hormones during pregnancy causes your breasts to make breast milk in your milk-producing glands. Hormones prevent breast milk from being released before your baby is born. They also prompt milk flow after birth. Once breastfeeding has begun, thoughts of your baby, as well as his or her sucking or crying, can stimulate the release of milk from your milk-producing glands. Benefits of breastfeeding Research shows that breastfeeding offers many health benefits for infants and mothers. It also offers a cost-free and convenient way to feed your baby. For your baby  Your first milk (colostrum) helps your baby's digestive system to function better.  Special cells in your milk (antibodies) help your baby to fight off infections.  Breastfed babies are less likely to develop asthma, allergies, obesity, or type 2 diabetes. They are also at lower risk for sudden infant death syndrome (SIDS).  Nutrients in breast milk are better able to meet your baby's needs compared to infant formula.  Breast milk improves your baby's brain development. For you  Breastfeeding helps to create a very special bond between you and your baby.  Breastfeeding is convenient. Breast milk costs nothing and is always available at the correct temperature.  Breastfeeding helps to burn calories. It helps you to lose the weight that you gained during pregnancy.  Breastfeeding makes your uterus return faster to its size before pregnancy. It also slows bleeding (lochia) after you give birth.  Breastfeeding helps to lower your risk of developing type 2  diabetes, osteoporosis, rheumatoid arthritis, cardiovascular disease, and breast, ovarian, uterine, and endometrial cancer later in life. Breastfeeding basics Starting breastfeeding  Find a comfortable place to sit or lie down, with your neck and back   well-supported.  Place a pillow or a rolled-up blanket under your baby to bring him or her to the level of your breast (if you are seated). Nursing pillows are specially designed to help support your arms and your baby while you breastfeed.  Make sure that your baby's tummy (abdomen) is facing your abdomen.  Gently massage your breast. With your fingertips, massage from the outer edges of your breast inward toward the nipple. This encourages milk flow. If your milk flows slowly, you may need to continue this action during the feeding.  Support your breast with 4 fingers underneath and your thumb above your nipple (make the letter "C" with your hand). Make sure your fingers are well away from your nipple and your baby's mouth.  Stroke your baby's lips gently with your finger or nipple.  When your baby's mouth is open wide enough, quickly bring your baby to your breast, placing your entire nipple and as much of the areola as possible into your baby's mouth. The areola is the colored area around your nipple. ? More areola should be visible above your baby's upper lip than below the lower lip. ? Your baby's lips should be opened and extended outward (flanged) to ensure an adequate, comfortable latch. ? Your baby's tongue should be between his or her lower gum and your breast.  Make sure that your baby's mouth is correctly positioned around your nipple (latched). Your baby's lips should create a seal on your breast and be turned out (everted).  It is common for your baby to suck about 2-3 minutes in order to start the flow of breast milk. Latching Teaching your baby how to latch onto your breast properly is very important. An improper latch can  cause nipple pain, decreased milk supply, and poor weight gain in your baby. Also, if your baby is not latched onto your nipple properly, he or she may swallow some air during feeding. This can make your baby fussy. Burping your baby when you switch breasts during the feeding can help to get rid of the air. However, teaching your baby to latch on properly is still the best way to prevent fussiness from swallowing air while breastfeeding. Signs that your baby has successfully latched onto your nipple  Silent tugging or silent sucking, without causing you pain. Infant's lips should be extended outward (flanged).  Swallowing heard between every 3-4 sucks once your milk has started to flow (after your let-down milk reflex occurs).  Muscle movement above and in front of his or her ears while sucking.  Signs that your baby has not successfully latched onto your nipple  Sucking sounds or smacking sounds from your baby while breastfeeding.  Nipple pain.  If you think your baby has not latched on correctly, slip your finger into the corner of your baby's mouth to break the suction and place it between your baby's gums. Attempt to start breastfeeding again. Signs of successful breastfeeding Signs from your baby  Your baby will gradually decrease the number of sucks or will completely stop sucking.  Your baby will fall asleep.  Your baby's body will relax.  Your baby will retain a small amount of milk in his or her mouth.  Your baby will let go of your breast by himself or herself.  Signs from you  Breasts that have increased in firmness, weight, and size 1-3 hours after feeding.  Breasts that are softer immediately after breastfeeding.  Increased milk volume, as well as a change in milk   consistency and color by the fifth day of breastfeeding.  Nipples that are not sore, cracked, or bleeding.  Signs that your baby is getting enough milk  Wetting at least 1-2 diapers during the first 24  hours after birth.  Wetting at least 5-6 diapers every 24 hours for the first week after birth. The urine should be clear or pale yellow by the age of 5 days.  Wetting 6-8 diapers every 24 hours as your baby continues to grow and develop.  At least 3 stools in a 24-hour period by the age of 5 days. The stool should be soft and yellow.  At least 3 stools in a 24-hour period by the age of 7 days. The stool should be seedy and yellow.  No loss of weight greater than 10% of birth weight during the first 3 days of life.  Average weight gain of 4-7 oz (113-198 g) per week after the age of 4 days.  Consistent daily weight gain by the age of 5 days, without weight loss after the age of 2 weeks. After a feeding, your baby may spit up a small amount of milk. This is normal. Breastfeeding frequency and duration Frequent feeding will help you make more milk and can prevent sore nipples and extremely full breasts (breast engorgement). Breastfeed when you feel the need to reduce the fullness of your breasts or when your baby shows signs of hunger. This is called "breastfeeding on demand." Signs that your baby is hungry include:  Increased alertness, activity, or restlessness.  Movement of the head from side to side.  Opening of the mouth when the corner of the mouth or cheek is stroked (rooting).  Increased sucking sounds, smacking lips, cooing, sighing, or squeaking.  Hand-to-mouth movements and sucking on fingers or hands.  Fussing or crying.  Avoid introducing a pacifier to your baby in the first 4-6 weeks after your baby is born. After this time, you may choose to use a pacifier. Research has shown that pacifier use during the first year of a baby's life decreases the risk of sudden infant death syndrome (SIDS). Allow your baby to feed on each breast as long as he or she wants. When your baby unlatches or falls asleep while feeding from the first breast, offer the second breast. Because  newborns are often sleepy in the first few weeks of life, you may need to awaken your baby to get him or her to feed. Breastfeeding times will vary from baby to baby. However, the following rules can serve as a guide to help you make sure that your baby is properly fed:  Newborns (babies 4 weeks of age or younger) may breastfeed every 1-3 hours.  Newborns should not go without breastfeeding for longer than 3 hours during the day or 5 hours during the night.  You should breastfeed your baby a minimum of 8 times in a 24-hour period.  Breast milk pumping Pumping and storing breast milk allows you to make sure that your baby is exclusively fed your breast milk, even at times when you are unable to breastfeed. This is especially important if you go back to work while you are still breastfeeding, or if you are not able to be present during feedings. Your lactation consultant can help you find a method of pumping that works best for you and give you guidelines about how long it is safe to store breast milk. Caring for your breasts while you breastfeed Nipples can become dry, cracked, and   sore while breastfeeding. The following recommendations can help keep your breasts moisturized and healthy:  Avoid using soap on your nipples.  Wear a supportive bra designed especially for nursing. Avoid wearing underwire-style bras or extremely tight bras (sports bras).  Air-dry your nipples for 3-4 minutes after each feeding.  Use only cotton bra pads to absorb leaked breast milk. Leaking of breast milk between feedings is normal.  Use lanolin on your nipples after breastfeeding. Lanolin helps to maintain your skin's normal moisture barrier. Pure lanolin is not harmful (not toxic) to your baby. You may also hand express a few drops of breast milk and gently massage that milk into your nipples and allow the milk to air-dry.  In the first few weeks after giving birth, some women experience breast engorgement.  Engorgement can make your breasts feel heavy, warm, and tender to the touch. Engorgement peaks within 3-5 days after you give birth. The following recommendations can help to ease engorgement:  Completely empty your breasts while breastfeeding or pumping. You may want to start by applying warm, moist heat (in the shower or with warm, water-soaked hand towels) just before feeding or pumping. This increases circulation and helps the milk flow. If your baby does not completely empty your breasts while breastfeeding, pump any extra milk after he or she is finished.  Apply ice packs to your breasts immediately after breastfeeding or pumping, unless this is too uncomfortable for you. To do this: ? Put ice in a plastic bag. ? Place a towel between your skin and the bag. ? Leave the ice on for 20 minutes, 2-3 times a day.  Make sure that your baby is latched on and positioned properly while breastfeeding.  If engorgement persists after 48 hours of following these recommendations, contact your health care provider or a lactation consultant. Overall health care recommendations while breastfeeding  Eat 3 healthy meals and 3 snacks every day. Well-nourished mothers who are breastfeeding need an additional 450-500 calories a day. You can meet this requirement by increasing the amount of a balanced diet that you eat.  Drink enough water to keep your urine pale yellow or clear.  Rest often, relax, and continue to take your prenatal vitamins to prevent fatigue, stress, and low vitamin and mineral levels in your body (nutrient deficiencies).  Do not use any products that contain nicotine or tobacco, such as cigarettes and e-cigarettes. Your baby may be harmed by chemicals from cigarettes that pass into breast milk and exposure to secondhand smoke. If you need help quitting, ask your health care provider.  Avoid alcohol.  Do not use illegal drugs or marijuana.  Talk with your health care provider before  taking any medicines. These include over-the-counter and prescription medicines as well as vitamins and herbal supplements. Some medicines that may be harmful to your baby can pass through breast milk.  It is possible to become pregnant while breastfeeding. If birth control is desired, ask your health care provider about options that will be safe while breastfeeding your baby. Where to find more information: [employer]: www.llli.org Contact a health care provider if:  You feel like you want to stop breastfeeding or have become frustrated with breastfeeding.  Your nipples are cracked or bleeding.  Your breasts are red, tender, or warm.  You have: ? Painful breasts or nipples. ? A swollen area on either breast. ? A fever or chills. ? Nausea or vomiting. ? Drainage other than breast milk from your nipples.  Your   breasts do not become full before feedings by the fifth day after you give birth.  You feel sad and depressed.  Your baby is: ? Too sleepy to eat well. ? Having trouble sleeping. ? More than 1 week old and wetting fewer than 6 diapers in a 24-hour period. ? Not gaining weight by 5 days of age.  Your baby has fewer than 3 stools in a 24-hour period.  Your baby's skin or the white parts of his or her eyes become yellow. Get help right away if:  Your baby is overly tired (lethargic) and does not want to wake up and feed.  Your baby develops an unexplained fever. Summary  Breastfeeding offers many health benefits for infant and mothers.  Try to breastfeed your infant when he or she shows early signs of hunger.  Gently tickle or stroke your baby's lips with your finger or nipple to allow the baby to open his or her mouth. Bring the baby to your breast. Make sure that much of the areola is in your baby's mouth. Offer one side and burp the baby before you offer the other side.  Talk with your health care provider or lactation consultant if you have  questions or you face problems as you breastfeed. This information is not intended to replace advice given to you by your health care provider. Make sure you discuss any questions you have with your health care provider. Document Released: 03/24/2005 Document Revised: 04/25/2016 Document Reviewed: 04/25/2016 Elsevier Interactive Patient Education  2018 Elsevier Inc.  

## 2017-08-18 NOTE — Progress Notes (Signed)
   PRENATAL VISIT NOTE  Subjective:  Leslie Thomas is a 22 y.o. G2P0010 at [redacted]w[redacted]d being seen today for ongoing prenatal care.  She is currently monitored for the following issues for this high-risk pregnancy and has Supervision of high risk pregnancy, antepartum; Short cervix; Cervical insufficiency during pregnancy in second trimester, antepartum; Evaluate anatomy not seen on prior sonogram; and Eczema on their problem list.  Patient reports backache.  Contractions: Not present. Vag. Bleeding: None.  Movement: Present. Denies leaking of fluid.   The following portions of the patient's history were reviewed and updated as appropriate: allergies, current medications, past family history, past medical history, past social history, past surgical history and problem list. Problem list updated.  Objective:   Vitals:   08/18/17 0814  BP: 105/67  Pulse: 93  Weight: 132 lb 12.8 oz (60.2 kg)    Fetal Status: Fetal Heart Rate (bpm): 155 Fundal Height: 26 cm Movement: Present     General:  Alert, oriented and cooperative. Patient is in no acute distress.  Skin: Skin is warm and dry. No rash noted.   Cardiovascular: Normal heart rate noted  Respiratory: Normal respiratory effort, no problems with respiration noted  Abdomen: Soft, gravid, appropriate for gestational age.  Pain/Pressure: Absent     Pelvic: Cervical exam deferred        Extremities: Normal range of motion.  Edema: None  Mental Status: Normal mood and affect. Normal behavior. Normal judgment and thought content.   Assessment and Plan:  Pregnancy: G2P0010 at [redacted]w[redacted]d  1. Encounter for supervision of normal pregnancy in third trimester, unspecified gravidity 28 wk labs and TDaP today - CBC - Glucose Tolerance, 2 Hours w/1 Hour - HIV antibody - RPR - Tdap vaccine greater than or equal to 7yo IM  2. Need for diphtheria-tetanus-pertussis (Tdap) vaccine - Tdap vaccine greater than or equal to 7yo IM  3. Cervical insufficiency during  pregnancy in second trimester, antepartum Continue Prometrium   Preterm labor symptoms and general obstetric precautions including but not limited to vaginal bleeding, contractions, leaking of fluid and fetal movement were reviewed in detail with the patient. Please refer to After Visit Summary for other counseling recommendations.  Return in 2 weeks (on 09/01/2017).  No future appointments.  Reva Bores, MD

## 2017-08-19 LAB — CBC
HEMATOCRIT: 35.6 % (ref 34.0–46.6)
Hemoglobin: 11.2 g/dL (ref 11.1–15.9)
MCH: 27.1 pg (ref 26.6–33.0)
MCHC: 31.5 g/dL (ref 31.5–35.7)
MCV: 86 fL (ref 79–97)
PLATELETS: 253 10*3/uL (ref 150–379)
RBC: 4.13 x10E6/uL (ref 3.77–5.28)
RDW: 13.4 % (ref 12.3–15.4)
WBC: 18.9 10*3/uL — AB (ref 3.4–10.8)

## 2017-08-19 LAB — GLUCOSE TOLERANCE, 2 HOURS W/ 1HR
GLUCOSE, 2 HOUR: 96 mg/dL (ref 65–152)
Glucose, 1 hour: 106 mg/dL (ref 65–179)
Glucose, Fasting: 79 mg/dL (ref 65–91)

## 2017-08-19 LAB — RPR: RPR Ser Ql: NONREACTIVE

## 2017-08-19 LAB — HIV ANTIBODY (ROUTINE TESTING W REFLEX): HIV SCREEN 4TH GENERATION: NONREACTIVE

## 2017-08-25 ENCOUNTER — Encounter: Payer: Self-pay | Admitting: Family Medicine

## 2017-08-29 ENCOUNTER — Encounter (HOSPITAL_COMMUNITY): Payer: Self-pay

## 2017-08-29 ENCOUNTER — Other Ambulatory Visit: Payer: Self-pay

## 2017-08-29 ENCOUNTER — Inpatient Hospital Stay (HOSPITAL_COMMUNITY)
Admission: AD | Admit: 2017-08-29 | Discharge: 2017-08-29 | Disposition: A | Discharge status: Home / Self Care | Payer: Managed Care, Other (non HMO) | Source: Ambulatory Visit | Attending: Obstetrics and Gynecology | Admitting: Obstetrics and Gynecology

## 2017-08-29 DIAGNOSIS — O4693 Antepartum hemorrhage, unspecified, third trimester: Secondary | ICD-10-CM | POA: Diagnosis not present

## 2017-08-29 DIAGNOSIS — O0993 Supervision of high risk pregnancy, unspecified, third trimester: Secondary | ICD-10-CM | POA: Diagnosis not present

## 2017-08-29 DIAGNOSIS — Z79899 Other long term (current) drug therapy: Secondary | ICD-10-CM | POA: Insufficient documentation

## 2017-08-29 DIAGNOSIS — R109 Unspecified abdominal pain: Secondary | ICD-10-CM | POA: Diagnosis not present

## 2017-08-29 DIAGNOSIS — Z3A29 29 weeks gestation of pregnancy: Secondary | ICD-10-CM | POA: Insufficient documentation

## 2017-08-29 DIAGNOSIS — O9989 Other specified diseases and conditions complicating pregnancy, childbirth and the puerperium: Secondary | ICD-10-CM | POA: Diagnosis not present

## 2017-08-29 DIAGNOSIS — O26853 Spotting complicating pregnancy, third trimester: Secondary | ICD-10-CM

## 2017-08-29 DIAGNOSIS — N939 Abnormal uterine and vaginal bleeding, unspecified: Secondary | ICD-10-CM

## 2017-08-29 DIAGNOSIS — O468X3 Other antepartum hemorrhage, third trimester: Secondary | ICD-10-CM | POA: Diagnosis present

## 2017-08-29 DIAGNOSIS — J45909 Unspecified asthma, uncomplicated: Secondary | ICD-10-CM | POA: Diagnosis not present

## 2017-08-29 DIAGNOSIS — O0990 Supervision of high risk pregnancy, unspecified, unspecified trimester: Secondary | ICD-10-CM

## 2017-08-29 LAB — WET PREP, GENITAL
Clue Cells Wet Prep HPF POC: NONE SEEN
Sperm: NONE SEEN
TRICH WET PREP: NONE SEEN
YEAST WET PREP: NONE SEEN

## 2017-08-29 LAB — URINALYSIS, ROUTINE W REFLEX MICROSCOPIC
Bilirubin Urine: NEGATIVE
Glucose, UA: NEGATIVE mg/dL
Hgb urine dipstick: NEGATIVE
KETONES UR: NEGATIVE mg/dL
Leukocytes, UA: NEGATIVE
NITRITE: NEGATIVE
PROTEIN: NEGATIVE mg/dL
Specific Gravity, Urine: 1.011 (ref 1.005–1.030)
pH: 7 (ref 5.0–8.0)

## 2017-08-29 NOTE — Discharge Instructions (Signed)
Vaginal Bleeding During Pregnancy, Third Trimester °A small amount of bleeding (spotting) from the vagina is relatively common in pregnancy. Various things can cause bleeding or spotting in pregnancy. Sometimes the bleeding is normal and is not a problem. However, bleeding during the third trimester can also be a sign of something serious for the mother and the baby. Be sure to tell your health care provider about any vaginal bleeding right away. °Some possible causes of vaginal bleeding during the third trimester include: °· The placenta may be partially or completely covering the opening to the cervix (placenta previa). °· The placenta may have separated from the uterus (abruption of the placenta). °· There may be an infection or growth on the cervix. °· You may be starting labor, called discharging of the mucus plug. °· The placenta may grow into the muscle layer of the uterus (placenta accreta). ° °Follow these instructions at home: °Watch your condition for any changes. The following actions may help to lessen any discomfort you are feeling: °· Follow your health care provider's instructions for limiting your activity. If your health care provider orders bed rest, you may need to stay in bed and only get up to use the bathroom. However, your health care provider may allow you to continue light activity. °· If needed, make plans for someone to help with your regular activities and responsibilities while you are on bed rest. °· Keep track of the number of pads you use each day, how often you change pads, and how soaked (saturated) they are. Write this down. °· Do not use tampons. Do not douche. °· Do not have sexual intercourse or orgasms until approved by your health care provider. °· Follow your health care provider's advice about lifting, driving, and physical activities. °· If you pass any tissue from your vagina, save the tissue so you can show it to your health care provider. °· Only take over-the-counter  or prescription medicines as directed by your health care provider. °· Do not take aspirin because it can make you bleed. °· Keep all follow-up appointments as directed by your health care provider. ° °Contact a health care provider if: °· You have any vaginal bleeding during any part of your pregnancy. °· You have cramps or labor pains. °· You have a fever, not controlled by medicine. °Get help right away if: °· You have severe cramps or pain in your back or belly (abdomen). °· You have chills. °· You have a gush of fluid from the vagina. °· You pass large clots or tissue from your vagina. °· Your bleeding increases. °· You feel light-headed or weak. °· You pass out. °· You feel less movement or no movement of the baby. °This information is not intended to replace advice given to you by your health care provider. Make sure you discuss any questions you have with your health care provider. °Document Released: 06/14/2002 Document Revised: 08/30/2015 Document Reviewed: 11/29/2012 °Elsevier Interactive Patient Education © 2018 Elsevier Inc. ° °

## 2017-08-29 NOTE — MAU Provider Note (Signed)
Patient Leslie Thomas is a 22 y.o. G2P0010 At 108w1d here with complaints of a a spot of blood and occasional right lower quadrant pain twice a day.  History     CSN: [id number]  Arrival date and time: 08/29/17 1403   First Provider Initiated Contact with Patient 08/29/17 1500      Chief Complaint  Patient presents with  . Vaginal Bleeding   Vaginal Bleeding  The patient's primary symptoms include vaginal bleeding. The patient's pertinent negatives include no genital itching or genital lesions. This is a new problem. The current episode started today. The problem occurs rarely. The problem has been resolved. Associated symptoms include abdominal pain. Pertinent negatives include no constipation, diarrhea, urgency or vomiting. The vaginal bleeding is spotting. She has not been passing clots. She has not been passing tissue. Nothing aggravates the symptoms. She has tried nothing for the symptoms.  Abdominal Pain  Pertinent negatives include no constipation, diarrhea or vomiting.  Patient also endorses round ligament pain that bothers her a few times a day. She doesn't feel contractions, denies suprapubic pain, pelvic pain or pressure.   OB History    Gravida  2   Para      Term      Preterm      AB  1   Living        SAB      TAB  1   Ectopic      Multiple      Live Births              Past Medical History:  Diagnosis Date  . Asthma    seasonal - rarely uses inhaler  . Pneumonia    6th grade  . Termination of pregnancy (fetus)    x 1 in clinic     Past Surgical History:  Procedure Laterality Date  . CERVICAL CERCLAGE N/A 06/13/2017   Procedure: CERCLAGE CERVICAL;  Surgeon: Reva Bores, MD;  Location: [facility];  Service: Gynecology;  Laterality: N/A;  . INDUCED ABORTION  2017  . MULTIPLE TOOTH EXTRACTIONS     general anesthesia    Family History  Problem Relation Age of Onset  . Asthma Mother     Social History   Tobacco Use  . Smoking status:  Never Smoker  . Smokeless tobacco: Never Used  Substance Use Topics  . Alcohol use: No    Frequency: Never  . Drug use: No    Allergies: No Known Allergies  Medications Prior to Admission  Medication Sig Dispense Refill Last Dose  . albuterol (PROVENTIL HFA;VENTOLIN HFA) 108 (90 Base) MCG/ACT inhaler Inhale 2 puffs into the lungs every 6 (six) hours as needed for wheezing or shortness of breath.    Not Taking  . Prenatal Vit-Fe Fumarate-FA (PREPLUS) 27-1 MG TABS Take 1 tablet by mouth daily. 30 tablet 5 Taking  . progesterone (PROMETRIUM) 200 MG capsule Place one capsule vaginally at bedtime 30 capsule 3 Taking  . triamcinolone (KENALOG) 0.025 % ointment Apply 1 application topically 2 (two) times daily. (Patient not taking: Reported on 08/05/2017) 30 g 0 Not Taking    Review of Systems  Constitutional: Negative.   HENT: Negative.   Respiratory: Negative.   Gastrointestinal: Positive for abdominal pain. Negative for constipation, diarrhea and vomiting.  Genitourinary: Positive for vaginal bleeding. Negative for urgency.  Musculoskeletal: Negative.   Neurological: Negative.   Hematological: Negative.    Physical Exam   Blood pressure (!) 106/59, pulse 77, temperature  98.8 F (37.1 C), resp. rate 17, weight 137 lb 12 oz (62.5 kg), last menstrual period 01/29/2017, SpO2 100 %.  Physical Exam  Constitutional: She appears well-developed.  HENT:  Head: Normocephalic.  Neck: Normal range of motion.  GI: Soft.  Genitourinary:  Genitourinary Comments: Normal external female genitalia; no blood in the vagina.  No discharge in the vagina.  Cerclage visualized; cervical exam deferred. No external hemorroids.  Musculoskeletal: Normal range of motion.  Neurological: She is alert.  Skin: Skin is warm and dry.  Psychiatric: She has a normal mood and affect.    MAU Course  Procedures  MDM UA: negative for signs of infection NST: 145 bpm, no contractions, present acel, no decels. Mod variability.  No bleeding or pain while in MAU'; patient stable for discharge. Encouraged patient  Assessment and Plan   1. Vaginal bleeding   2. Supervision of high risk pregnancy, antepartum    2. Patient stable for discharge; bleeding precautions and preterm labor precautions reviewed.   3. Continue nightly prometrium; keep appt on Tuesday at [facility].   4. Patient verbalized understanding; all questions answered.   Luna Kitchens   Charlesetta Garibaldi Candy Leverett 08/29/2017, 3:24 PM

## 2017-08-29 NOTE — MAU Note (Signed)
Had a cerclage placed in March.  Is using progesterone, nightly.  Forgot dose last night. Noted a speck of red this morning when used the restroom.  Has started a new job, doing a lot of walking. Having some pain in RLQ, started about 4 days ago. Comes and goes, once or twice a day.

## 2017-09-01 ENCOUNTER — Ambulatory Visit (INDEPENDENT_AMBULATORY_CARE_PROVIDER_SITE_OTHER): Payer: Managed Care, Other (non HMO) | Admitting: Obstetrics and Gynecology

## 2017-09-01 VITALS — BP 107/62 | HR 82 | Wt 137.6 lb

## 2017-09-01 DIAGNOSIS — O0990 Supervision of high risk pregnancy, unspecified, unspecified trimester: Secondary | ICD-10-CM

## 2017-09-01 DIAGNOSIS — O3432 Maternal care for cervical incompetence, second trimester: Secondary | ICD-10-CM

## 2017-09-01 LAB — GC/CHLAMYDIA PROBE AMP (~~LOC~~) NOT AT ARMC
CHLAMYDIA, DNA PROBE: NEGATIVE
Neisseria Gonorrhea: NEGATIVE

## 2017-09-01 NOTE — Progress Notes (Signed)
Subjective:  Leslie Thomas is a 22 y.o. G2P0010 at [redacted]w[redacted]d being seen today for ongoing prenatal care.  She is currently monitored for the following issues for this high-risk pregnancy and has Supervision of high risk pregnancy, antepartum; Short cervix; Cervical insufficiency during pregnancy in second trimester, antepartum; Evaluate anatomy not seen on prior sonogram; and Eczema on their problem list.  Patient reports no complaints.  Contractions: Not present. Vag. Bleeding: None.  Movement: Present. Denies leaking of fluid.   The following portions of the patient's history were reviewed and updated as appropriate: allergies, current medications, past family history, past medical history, past social history, past surgical history and problem list. Problem list updated.  Objective:   Vitals:   09/01/17 1839  BP: 107/62  Pulse: 82  Weight: 137 lb 9.6 oz (62.4 kg)    Fetal Status: Fetal Heart Rate (bpm): 139   Movement: Present     General:  Alert, oriented and cooperative. Patient is in no acute distress.  Skin: Skin is warm and dry. No rash noted.   Cardiovascular: Normal heart rate noted  Respiratory: Normal respiratory effort, no problems with respiration noted  Abdomen: Soft, gravid, appropriate for gestational age. Pain/Pressure: Present     Pelvic: Vag. Bleeding: None     Cervical exam deferred        Extremities: Normal range of motion.  Edema: None  Mental Status: Normal mood and affect. Normal behavior. Normal judgment and thought content.   Urinalysis:      Assessment and Plan:  Pregnancy: G2P0010 at [redacted]w[redacted]d  1. Supervision of high risk pregnancy, antepartum Doing well. Routine care. 28wk labs reviewed and normal.  2. Cervical insufficiency during pregnancy in second trimester, antepartum Cerclage in place. Also on Prometrium. MFM states no further [place] for cervical length needed. Denies preterm contractions.   Of note, patient plans on moving back to [place] in July.    Preterm labor symptoms and general obstetric precautions including but not limited to vaginal bleeding, contractions, leaking of fluid and fetal movement were reviewed in detail with the patient. Please refer to After Visit Summary for other counseling recommendations.  Return in about 2 weeks (around 09/15/2017) for ob visit.   Pincus Large, DO

## 2017-09-01 NOTE — Patient Instructions (Signed)

## 2017-09-10 ENCOUNTER — Encounter: Payer: Self-pay | Admitting: Family Medicine

## 2017-09-17 ENCOUNTER — Ambulatory Visit (INDEPENDENT_AMBULATORY_CARE_PROVIDER_SITE_OTHER): Payer: Managed Care, Other (non HMO) | Admitting: Family Medicine

## 2017-09-17 VITALS — BP 113/63 | HR 90 | Wt 141.0 lb

## 2017-09-17 DIAGNOSIS — O3432 Maternal care for cervical incompetence, second trimester: Secondary | ICD-10-CM

## 2017-09-17 DIAGNOSIS — O0990 Supervision of high risk pregnancy, unspecified, unspecified trimester: Secondary | ICD-10-CM

## 2017-09-17 NOTE — Patient Instructions (Signed)

## 2017-09-17 NOTE — Progress Notes (Signed)
    PRENATAL VISIT NOTE  Subjective:  Leslie Thomas is a 22 y.o. G2P0010 at 2240w6d being seen today for ongoing prenatal care.  She is currently monitored for the following issues for this high-risk pregnancy and has Supervision of high risk pregnancy, antepartum; Short cervix; Cervical insufficiency during pregnancy in second trimester, antepartum; and Eczema on their problem list.  Patient reports no bleeding, no contractions and no leaking.  Contractions: Not present. Vag. Bleeding: None.  Movement: Present. Denies leaking of fluid.   The following portions of the patient's history were reviewed and updated as appropriate: allergies, current medications, past family history, past medical history, past social history, past surgical history and problem list. Problem list updated.  Objective:   Vitals:   09/17/17 1054  BP: 113/63  Pulse: 90  Weight: 141 lb (64 kg)    Fetal Status: Fetal Heart Rate (bpm): 153 Fundal Height: 30 cm Movement: Present     General:  Alert, oriented and cooperative. Patient is in no acute distress.  Skin: Skin is warm and dry. No rash noted.   Cardiovascular: Normal heart rate noted  Respiratory: Normal respiratory effort, no problems with respiration noted  Abdomen: Soft, gravid, appropriate for gestational age.  Pain/Pressure: Present     Pelvic: Cervical exam deferred        Extremities: Normal range of motion.  Edema: None  Mental Status: Normal mood and affect. Normal behavior. Normal judgment and thought content.   Assessment and Plan:  Pregnancy: G2P0010 at 7220w6d  1. Cervical insufficiency during pregnancy in second trimester, antepartum Cerclage in place--removal at 37 wks Continue prometrium  2. Supervision of high risk pregnancy, antepartum Nml 28 wk labs Planning to travel to [place] for delivery around 36 wks-her mother is there [facility]will need [facility] to go with her.  Preterm labor symptoms and general  obstetric precautions including but not limited to vaginal bleeding, contractions, leaking of fluid and fetal movement were reviewed in detail with the patient. Please refer to After Visit Summary for other counseling recommendations.  Return in 2 weeks (on 10/01/2017).  Future Appointments  Date Time Provider Department Center  10/02/2017  8:15 AM Hermina StaggersErvin, Michael L, MD [facility]    Reva Boresanya S Isatou Agredano, MD

## 2017-09-28 ENCOUNTER — Encounter: Payer: Self-pay | Admitting: Family Medicine

## 2017-10-02 ENCOUNTER — Encounter: Payer: Self-pay | Admitting: Obstetrics and Gynecology

## 2017-10-02 ENCOUNTER — Ambulatory Visit (INDEPENDENT_AMBULATORY_CARE_PROVIDER_SITE_OTHER): Payer: Managed Care, Other (non HMO) | Admitting: Obstetrics and Gynecology

## 2017-10-02 VITALS — BP 98/68 | HR 111 | Wt 141.7 lb

## 2017-10-02 DIAGNOSIS — O0990 Supervision of high risk pregnancy, unspecified, unspecified trimester: Secondary | ICD-10-CM

## 2017-10-02 DIAGNOSIS — O0993 Supervision of high risk pregnancy, unspecified, third trimester: Secondary | ICD-10-CM

## 2017-10-02 DIAGNOSIS — O3433 Maternal care for cervical incompetence, third trimester: Secondary | ICD-10-CM

## 2017-10-02 NOTE — Progress Notes (Signed)
Subjective:  Leslie Thomas is a 22 y.o. G2P0010 at 5353w0d being seen today for ongoing prenatal care.  She is currently monitored for the following issues for this high-risk pregnancy and has Supervision of high risk pregnancy, antepartum; Eczema; and Cervical insufficiency in pregnancy in third trimester, antepartum on their problem list.  Patient reports no complaints.  Contractions: Not present. Vag. Bleeding: None.  Movement: Present. Denies leaking of fluid.   The following portions of the patient's history were reviewed and updated as appropriate: allergies, current medications, past family history, past medical history, past social history, past surgical history and problem list. Problem list updated.  Objective:   Vitals:   10/02/17 0825  BP: 98/68  Pulse: (!) 111  Weight: 141 lb 11.2 oz (64.3 kg)    Fetal Status: Fetal Heart Rate (bpm): 144   Movement: Present     General:  Alert, oriented and cooperative. Patient is in no acute distress.  Skin: Skin is warm and dry. No rash noted.   Cardiovascular: Normal heart rate noted  Respiratory: Normal respiratory effort, no problems with respiration noted  Abdomen: Soft, gravid, appropriate for gestational age. Pain/Pressure: Absent     Pelvic:  Cervical exam deferred        Extremities: Normal range of motion.  Edema: None  Mental Status: Normal mood and affect. Normal behavior. Normal judgment and thought content.   Urinalysis:      Assessment and Plan:  Pregnancy: G2P0010 at 6253w0d  1. Supervision of high risk pregnancy, antepartum Stable  2. Cervical insufficiency in pregnancy in third trimester, antepartum Cerclage inplace Continue with Prometrium  Preterm labor symptoms and general obstetric precautions including but not limited to vaginal bleeding, contractions, leaking of fluid and fetal movement were reviewed in detail with the patient. Please refer to After Visit Summary for other counseling recommendations.  Return  in about 2 weeks (around 10/16/2017) for OB visit.   Hermina StaggersErvin, Lashawna Poche L, MD

## 2017-10-02 NOTE — Patient Instructions (Signed)
Third Trimester of Pregnancy The third trimester is from week 28 through week 40 (months 7 through 9). The third trimester is a time when the unborn baby (fetus) is growing rapidly. At the end of the ninth month, the fetus is about 20 inches in length and weighs 6-10 pounds. Body changes during your third trimester Your body will continue to go through many changes during pregnancy. The changes vary from woman to woman. During the third trimester:  Your weight will continue to increase. You can expect to gain 25-35 pounds (11-16 kg) by the end of the pregnancy.  You may begin to get stretch marks on your hips, abdomen, and breasts.  You may urinate more often because the fetus is moving lower into your pelvis and pressing on your bladder.  You may develop or continue to have heartburn. This is caused by increased hormones that slow down muscles in the digestive tract.  You may develop or continue to have constipation because increased hormones slow digestion and cause the muscles that push waste through your intestines to relax.  You may develop hemorrhoids. These are swollen veins (varicose veins) in the rectum that can itch or be painful.  You may develop swollen, bulging veins (varicose veins) in your legs.  You may have increased body aches in the pelvis, back, or thighs. This is due to weight gain and increased hormones that are relaxing your joints.  You may have changes in your hair. These can include thickening of your hair, rapid growth, and changes in texture. Some women also have hair loss during or after pregnancy, or hair that feels dry or thin. Your hair will most likely return to normal after your baby is born.  Your breasts will continue to grow and they will continue to become tender. A yellow fluid (colostrum) may leak from your breasts. This is the first milk you are producing for your baby.  Your belly button may stick out.  You may notice more swelling in your hands,  face, or ankles.  You may have increased tingling or numbness in your hands, arms, and legs. The skin on your belly may also feel numb.  You may feel short of breath because of your expanding uterus.  You may have more problems sleeping. This can be caused by the size of your belly, increased need to urinate, and an increase in your body's metabolism.  You may notice the fetus "dropping," or moving lower in your abdomen (lightening).  You may have increased vaginal discharge.  You may notice your joints feel loose and you may have pain around your pelvic bone.  What to expect at prenatal visits You will have prenatal exams every 2 weeks until week 36. Then you will have weekly prenatal exams. During a routine prenatal visit:  You will be weighed to make sure you and the baby are growing normally.  Your blood pressure will be taken.  Your abdomen will be measured to track your baby's growth.  The fetal heartbeat will be listened to.  Any test results from the previous visit will be discussed.  You may have a cervical check near your due date to see if your cervix has softened or thinned (effaced).  You will be tested for Group B streptococcus. This happens between 35 and 37 weeks.  Your health care provider may ask you:  What your birth plan is.  How you are feeling.  If you are feeling the baby move.  If you have had   any abnormal symptoms, such as leaking fluid, bleeding, severe headaches, or abdominal cramping.  If you are using any tobacco products, including cigarettes, chewing tobacco, and electronic cigarettes.  If you have any questions.  Other tests or screenings that may be performed during your third trimester include:  Blood tests that check for low iron levels (anemia).  Fetal testing to check the health, activity level, and growth of the fetus. Testing is done if you have certain medical conditions or if there are problems during the  pregnancy.  Nonstress test (NST). This test checks the health of your baby to make sure there are no signs of problems, such as the baby not getting enough oxygen. During this test, a belt is placed around your belly. The baby is made to move, and its heart rate is monitored during movement.  What is false labor? False labor is a condition in which you feel small, irregular tightenings of the muscles in the womb (contractions) that usually go away with rest, changing position, or drinking water. These are called Braxton Hicks contractions. Contractions may last for hours, days, or even weeks before true labor sets in. If contractions come at regular intervals, become more frequent, increase in intensity, or become painful, you should see your health care provider. What are the signs of labor?  Abdominal cramps.  Regular contractions that start at 10 minutes apart and become stronger and more frequent with time.  Contractions that start on the top of the uterus and spread down to the lower abdomen and back.  Increased pelvic pressure and dull back pain.  A watery or bloody mucus discharge that comes from the vagina.  Leaking of amniotic fluid. This is also known as your "water breaking." It could be a slow trickle or a gush. Let your health care provider know if it has a color or strange odor. If you have any of these signs, call your health care provider right away, even if it is before your due date. Follow these instructions at home: Medicines  Follow your health care provider's instructions regarding medicine use. Specific medicines may be either safe or unsafe to take during pregnancy.  Take a prenatal vitamin that contains at least 600 micrograms (mcg) of folic acid.  If you develop constipation, try taking a stool softener if your health care provider approves. Eating and drinking  Eat a balanced diet that includes fresh fruits and vegetables, whole grains, good sources of protein  such as meat, eggs, or tofu, and low-fat dairy. Your health care provider will help you determine the amount of weight gain that is right for you.  Avoid raw meat and uncooked cheese. These carry germs that can cause birth defects in the baby.  If you have low calcium intake from food, talk to your health care provider about whether you should take a daily calcium supplement.  Eat four or five small meals rather than three large meals a day.  Limit foods that are high in fat and processed sugars, such as fried and sweet foods.  To prevent constipation: ? Drink enough fluid to keep your urine clear or pale yellow. ? Eat foods that are high in fiber, such as fresh fruits and vegetables, whole grains, and beans. Activity  Exercise only as directed by your health care provider. Most women can continue their usual exercise routine during pregnancy. Try to exercise for 30 minutes at least 5 days a week. Stop exercising if you experience uterine contractions.  Avoid heavy   lifting.  Do not exercise in extreme heat or humidity, or at high altitudes.  Wear low-heel, comfortable shoes.  Practice good posture.  You may continue to have sex unless your health care provider tells you otherwise. Relieving pain and discomfort  Take frequent breaks and rest with your legs elevated if you have leg cramps or low back pain.  Take warm sitz baths to soothe any pain or discomfort caused by hemorrhoids. Use hemorrhoid cream if your health care provider approves.  Wear a good support bra to prevent discomfort from breast tenderness.  If you develop varicose veins: ? Wear support pantyhose or compression stockings as told by your healthcare provider. ? Elevate your feet for 15 minutes, 3-4 times a day. Prenatal care  Write down your questions. Take them to your prenatal visits.  Keep all your prenatal visits as told by your health care provider. This is important. Safety  Wear your seat belt at  all times when driving.  Make a list of emergency phone numbers, including numbers for family, friends, the hospital, and police and fire departments. General instructions  Avoid cat litter boxes and soil used by cats. These carry germs that can cause birth defects in the baby. If you have a cat, ask someone to clean the litter box for you.  Do not travel far distances unless it is absolutely necessary and only with the approval of your health care provider.  Do not use hot tubs, steam rooms, or saunas.  Do not drink alcohol.  Do not use any products that contain nicotine or tobacco, such as cigarettes and e-cigarettes. If you need help quitting, ask your health care provider.  Do not use any medicinal herbs or unprescribed drugs. These chemicals affect the formation and growth of the baby.  Do not douche or use tampons or scented sanitary pads.  Do not cross your legs for long periods of time.  To prepare for the arrival of your baby: ? Take prenatal classes to understand, practice, and ask questions about labor and delivery. ? Make a trial run to the hospital. ? Visit the hospital and tour the maternity area. ? Arrange for maternity or paternity leave through employers. ? Arrange for family and friends to take care of pets while you are in the hospital. ? Purchase a rear-facing car seat and make sure you know how to install it in your car. ? Pack your hospital bag. ? Prepare the baby's nursery. Make sure to remove all pillows and stuffed animals from the baby's crib to prevent suffocation.  Visit your dentist if you have not gone during your pregnancy. Use a soft toothbrush to brush your teeth and be gentle when you floss. Contact a health care provider if:  You are unsure if you are in labor or if your water has broken.  You become dizzy.  You have mild pelvic cramps, pelvic pressure, or nagging pain in your abdominal area.  You have lower back pain.  You have persistent  nausea, vomiting, or diarrhea.  You have an unusual or bad smelling vaginal discharge.  You have pain when you urinate. Get help right away if:  Your water breaks before 37 weeks.  You have regular contractions less than 5 minutes apart before 37 weeks.  You have a fever.  You are leaking fluid from your vagina.  You have spotting or bleeding from your vagina.  You have severe abdominal pain or cramping.  You have rapid weight loss or weight gain.    You have shortness of breath with chest pain.  You notice sudden or extreme swelling of your face, hands, ankles, feet, or legs.  Your baby makes fewer than 10 movements in 2 hours.  You have severe headaches that do not go away when you take medicine.  You have vision changes. Summary  The third trimester is from week 28 through week 40, months 7 through 9. The third trimester is a time when the unborn baby (fetus) is growing rapidly.  During the third trimester, your discomfort may increase as you and your baby continue to gain weight. You may have abdominal, leg, and back pain, sleeping problems, and an increased need to urinate.  During the third trimester your breasts will keep growing and they will continue to become tender. A yellow fluid (colostrum) may leak from your breasts. This is the first milk you are producing for your baby.  False labor is a condition in which you feel small, irregular tightenings of the muscles in the womb (contractions) that eventually go away. These are called Braxton Hicks contractions. Contractions may last for hours, days, or even weeks before true labor sets in.  Signs of labor can include: abdominal cramps; regular contractions that start at 10 minutes apart and become stronger and more frequent with time; watery or bloody mucus discharge that comes from the vagina; increased pelvic pressure and dull back pain; and leaking of amniotic fluid. This information is not intended to replace advice  given to you by your health care provider. Make sure you discuss any questions you have with your health care provider. Document Released: 03/18/2001 Document Revised: 08/30/2015 Document Reviewed: 05/25/2012 Elsevier Interactive Patient Education  2017 [employer]  

## 2017-10-15 ENCOUNTER — Ambulatory Visit (INDEPENDENT_AMBULATORY_CARE_PROVIDER_SITE_OTHER): Payer: Managed Care, Other (non HMO) | Admitting: Obstetrics & Gynecology

## 2017-10-15 VITALS — BP 117/78 | HR 88 | Wt 141.8 lb

## 2017-10-15 DIAGNOSIS — O0990 Supervision of high risk pregnancy, unspecified, unspecified trimester: Secondary | ICD-10-CM

## 2017-10-15 DIAGNOSIS — Z113 Encounter for screening for infections with a predominantly sexual mode of transmission: Secondary | ICD-10-CM | POA: Diagnosis not present

## 2017-10-15 DIAGNOSIS — O3433 Maternal care for cervical incompetence, third trimester: Secondary | ICD-10-CM

## 2017-10-15 MED ORDER — PROMETHAZINE HCL 25 MG PO TABS: 25.0000 mg | ORAL_TABLET | Freq: Four times a day (QID) | ORAL | 2 refills | Status: AC | PRN

## 2017-10-15 NOTE — Progress Notes (Signed)
   PRENATAL VISIT NOTE  Subjective:  Leslie Thomas is a 22 y.o. G2P0010 at 4562w6d being seen today for ongoing prenatal care.  She is currently monitored for the following issues for this high-risk pregnancy and has Supervision of high risk pregnancy, antepartum; Eczema; and Cervical insufficiency in pregnancy in third trimester, antepartum on their problem list.  Patient reports no complaints.  Contractions: Not present. Vag. Bleeding: None.  Movement: Present. Denies leaking of fluid.  Moving to [place] on 10/19/17.  The following portions of the patient's history were reviewed and updated as appropriate: allergies, current medications, past family history, past medical history, past social history, past surgical history and problem list. Problem list updated.  Objective:   Vitals:   10/15/17 0902  BP: 117/78  Pulse: 88  Weight: 141 lb 12.8 oz (64.3 kg)    Fetal Status: Fetal Heart Rate (bpm): 141 Fundal Height: 36 cm Movement: Present     General:  Alert, oriented and cooperative. Patient is in no acute distress.  Skin: Skin is warm and dry. No rash noted.   Cardiovascular: Normal heart rate noted  Respiratory: Normal respiratory effort, no problems with respiration noted  Abdomen: Soft, gravid, appropriate for gestational age.  Pain/Pressure: Absent     Pelvic: Cervical exam performed Dilation: Closed Effacement (%): Thick Station: Ballotable  Extremities: Normal range of motion.  Edema: None  Mental Status: Normal mood and affect. Normal behavior. Normal judgment and thought content.   Assessment and Plan:  Pregnancy: G2P0010 at 5462w6d  1. Cervical insufficiency in pregnancy in third trimester, antepartum Cerclage in place. Will be removed by OB/GYN in [place].  2. Supervision of high risk pregnancy, antepartum Patient is moving to [place] on 10/19/17. Will take her records with her. Wanted an antiemetic for the plane journey. Pelvic cultures done today. - GC[facility]Chlamydia probe amp  [facility] - Culture, beta strep (group b only) - promethazine (PHENERGAN) 25 MG tablet; Take 1 tablet (25 mg total) by mouth every 6 (six) hours as needed for nausea or vomiting.  Dispense: 30 tablet; Refill: 2  Preterm labor symptoms and general obstetric precautions including but not limited to vaginal bleeding, contractions, leaking of fluid and fetal movement were reviewed in detail with the patient. Please refer to After Visit Summary for other counseling recommendations.  Follow up in OB/GYN in [place].  No future appointments.  Jaynie CollinsUgonna Avanell Banwart, MD

## 2017-10-15 NOTE — Patient Instructions (Signed)
Return to clinic for any scheduled appointments or obstetric concerns, or go to [facility] for evaluation  

## 2017-10-16 LAB — GC/CHLAMYDIA PROBE AMP (~~LOC~~) NOT AT ARMC
Chlamydia: NEGATIVE
NEISSERIA GONORRHEA: NEGATIVE

## 2017-10-19 LAB — CULTURE, BETA STREP (GROUP B ONLY): STREP GP B CULTURE: NEGATIVE

## 2017-10-20 ENCOUNTER — Encounter: Payer: Self-pay | Admitting: Family Medicine

## 2017-10-22 ENCOUNTER — Encounter: Payer: Self-pay | Admitting: Obstetrics & Gynecology

## 2017-10-23 ENCOUNTER — Encounter: Payer: Self-pay | Admitting: Family Medicine

## 2017-12-15 ENCOUNTER — Other Ambulatory Visit: Payer: Self-pay | Admitting: Family Medicine

## 2018-02-05 ENCOUNTER — Encounter (HOSPITAL_COMMUNITY): Payer: Self-pay

## 2019-11-19 IMAGING — US US MFM OB TRANSVAGINAL
1 series · 15 of 28 positions shown · non-contrast
Comparison: none

[Series 1: us mfm ob transvaginal · 39 acquisitions, 15 frames shown]
[im 1/39]
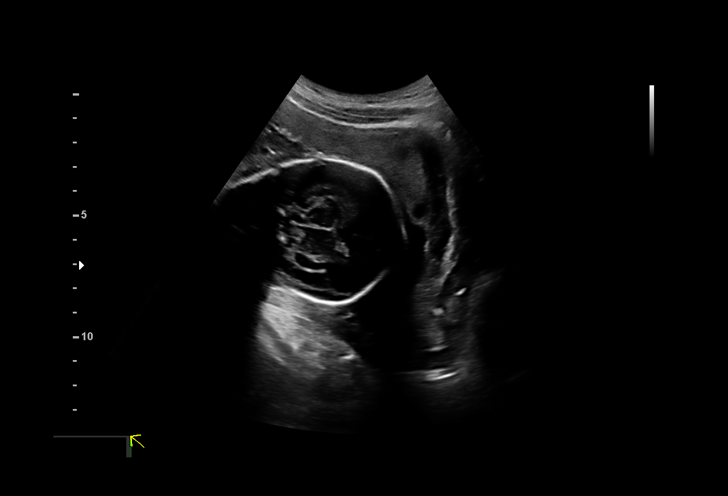
[im 3/39]
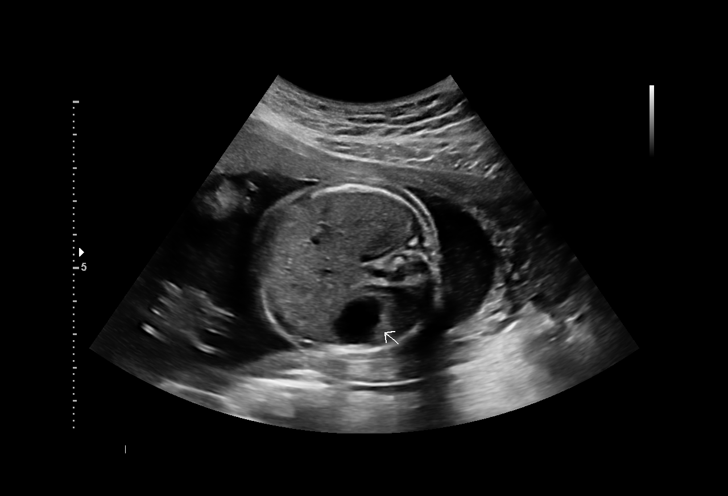
[im 6/39]
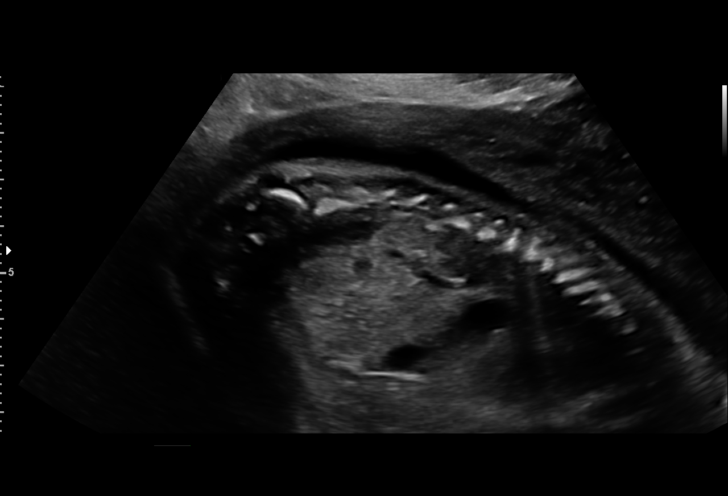
[im 9/39]
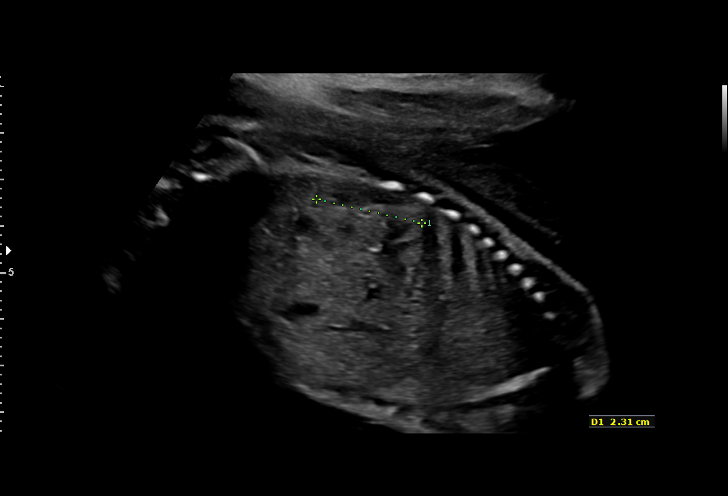
[im 12/39]
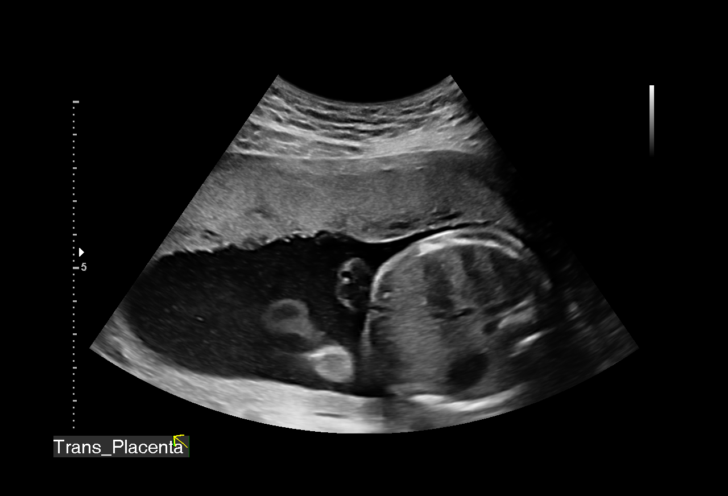
[im 15/39]
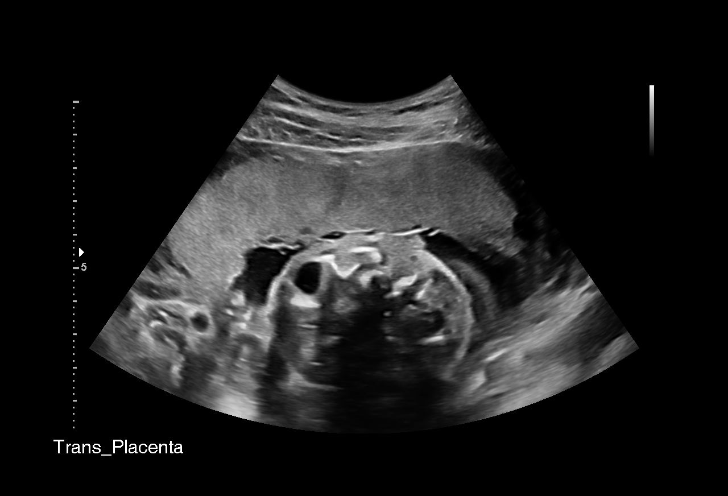
[im 17/39]
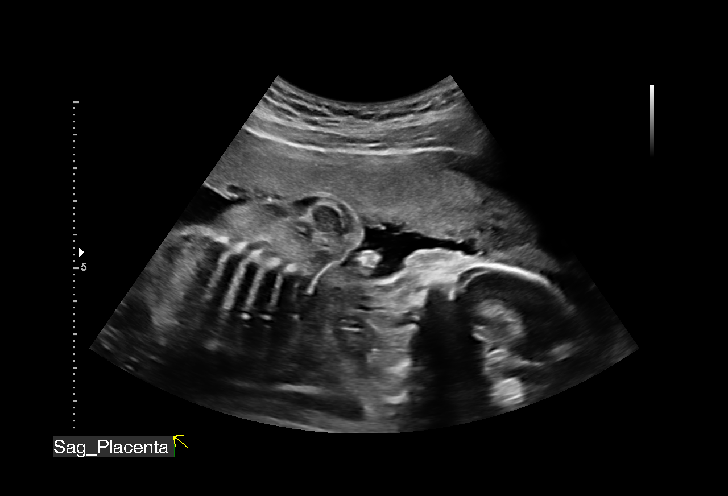
[im 20/39]
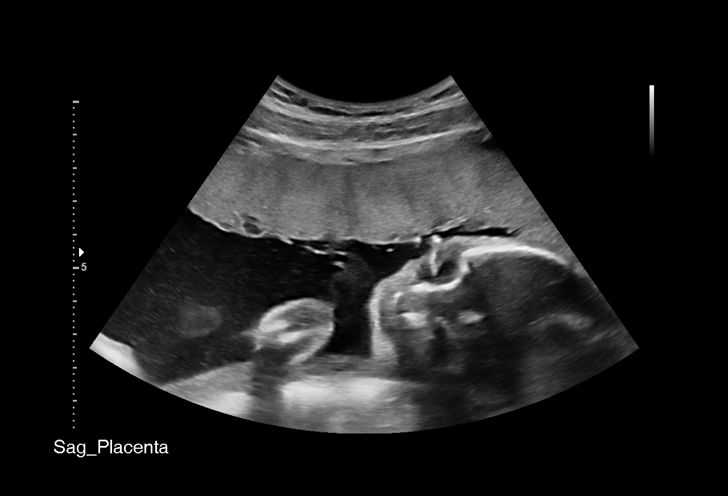
[im 22/39]
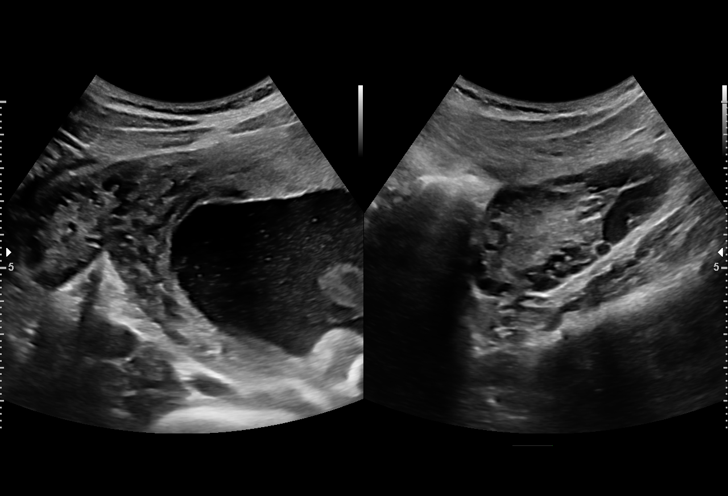
[im 24/39]
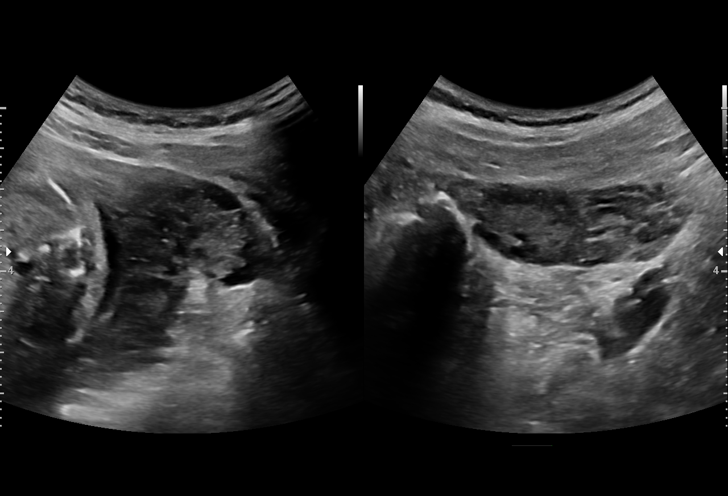
[im 27/39]
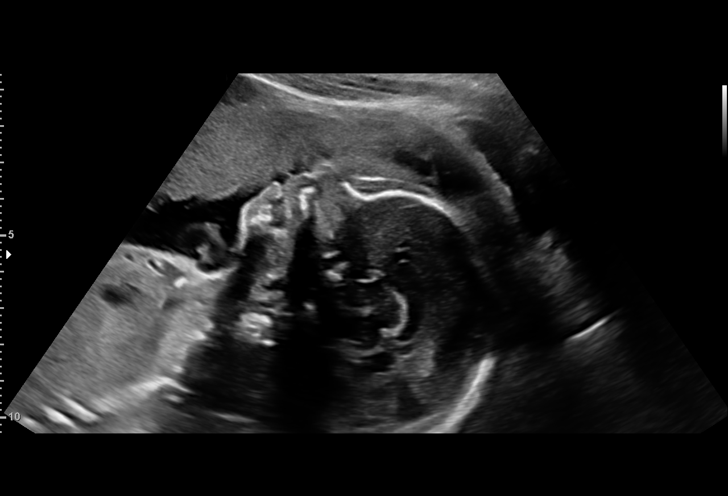
[im 30/39]
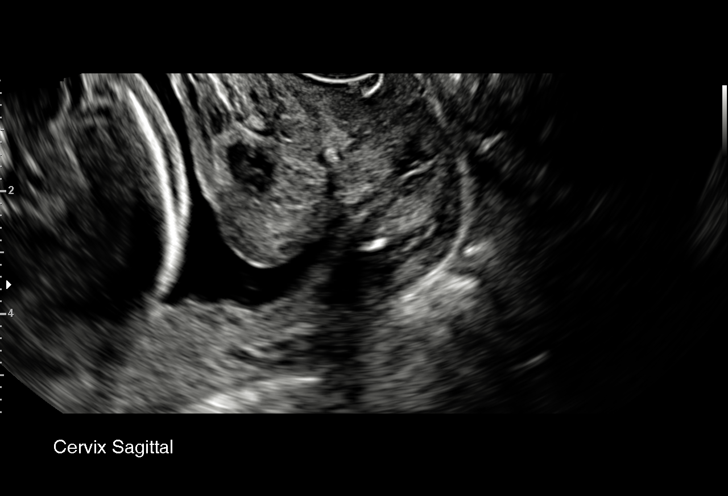
[im 33/39]
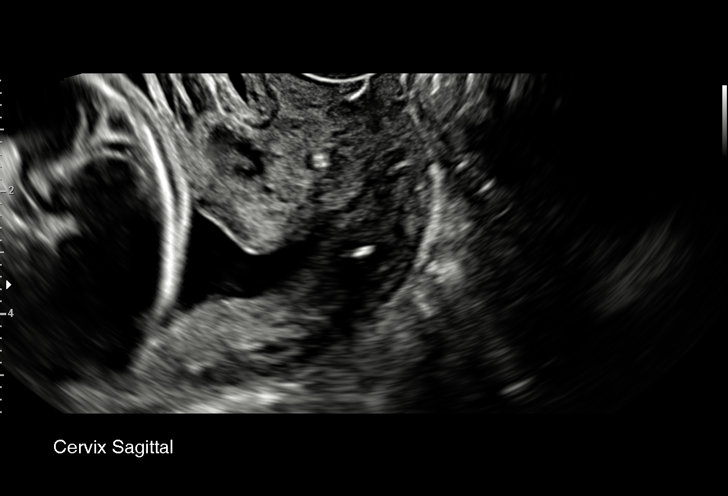
[im 36/39]
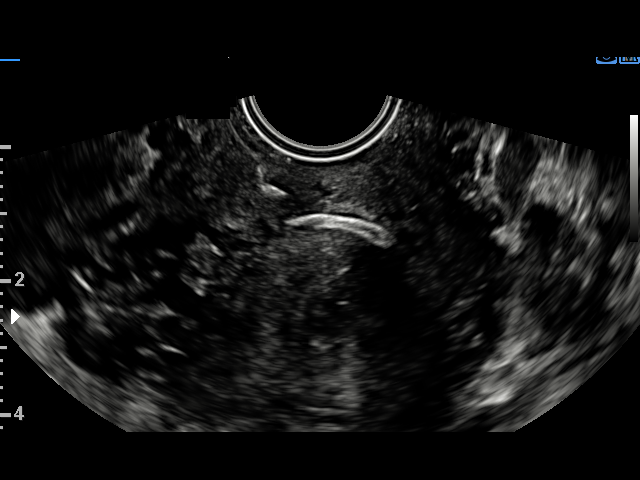
[im 39/39]
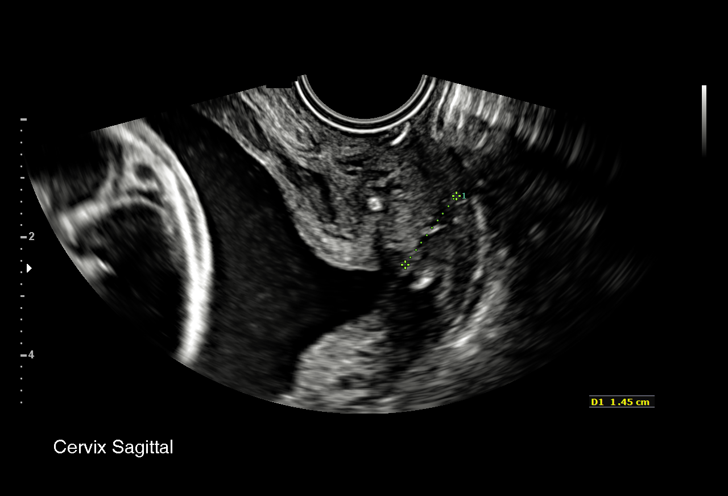

[15 of 28 positions shown; findings below may reference images not displayed]

[facility] [[facility]OS[facility]]

Indications

23 weeks gestation of pregnancy
Cervical shortening complicating pregnancy
(rescue cerclage 06/13/17)
Encounter for cervical length
OB History

Blood Type:            Height:  5'     Weight (lb):  121       BMI:
Gravidity:    2         Term:   0        Prem:   0        SAB:   0
TOP:          1       Ectopic:  0        Living: 0
Fetal Evaluation

Num Of Fetuses:     1
Fetal Heart         151
Rate(bpm):
Cardiac Activity:   Observed
Presentation:       Cephalic
Placenta:           Anterior, above cervical os
P. Cord Insertion:  Previously Visualized

Amniotic Fluid
AFI FV:      Subjectively within normal limits

Largest Pocket(cm)
4.82
Gestational Age

LMP:           24w 5d        Date:  01/29/17                 EDD:   11/05/17
Best:          23w 4d     Det. By:  U/S  (06/11/17)          EDD:   11/13/17
Anatomy

Stomach:               Appears normal, left   Bladder:                Appears normal
sided
Kidneys:               Appear normal
Cervix Uterus Adnexa

Cervix
Length:            1.5  cm.
Measured transvaginally. Cerclage visualized.  Funneling of internal
os noted.

Uterus
No abnormality visualized.

Left Ovary
Size(cm)       2.7  x   1.9    x  1.9       Vol(ml):
Within normal limits.

Right Ovary
Size(cm)       4.1  x   2.8    x  2.6       Vol(ml):
Within normal limits.
Impression

Single living intrauterine pregnancy at 23 weeks 4 days.
Normal amniotic fluid volume.
Transvaginal cervical length of 1.5 cm.
Cerclage remains in proper place.
Recommendations

Recommend follow-up ultrasound examination in 2 weeks to
reassess cervical length.
If cervical length remains unchanged serial cervical length
assessments will no longer be needed, but cervical length
could be repeated if Ms. Berhanu symptoms change.

## 2019-12-04 IMAGING — US US MFM OB TRANSVAGINAL
1 series · 15 of 28 positions shown · non-contrast
Comparison: none

[Series 1: us mfm ob transvaginal · 28 acquisitions, 15 frames shown]
[im 1/28]
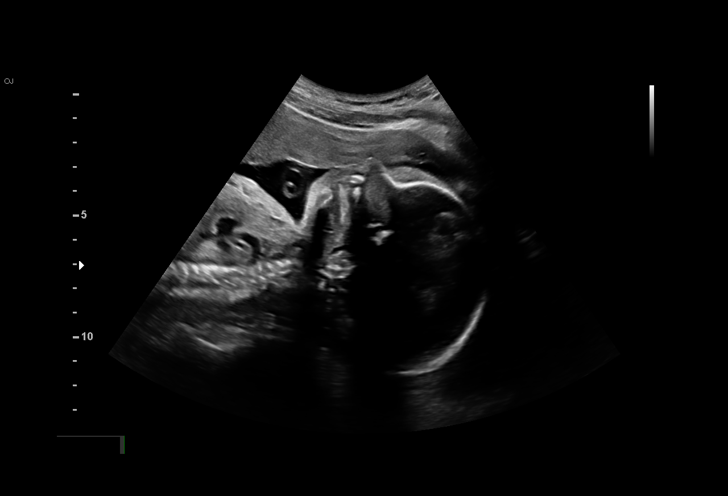
[im 3/28]
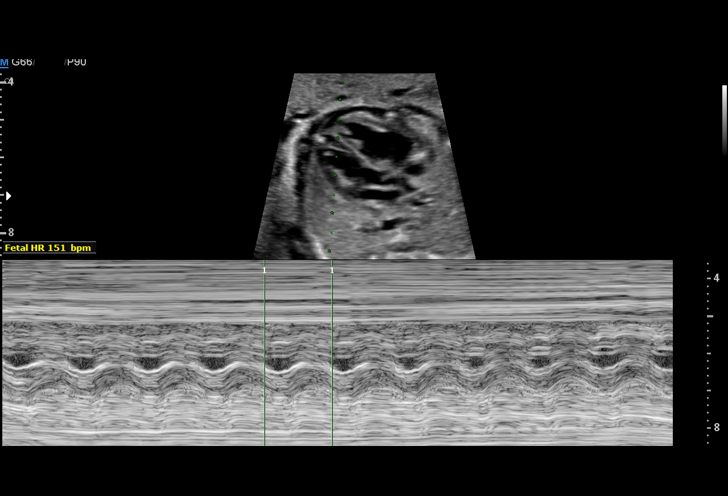
[im 5/28]
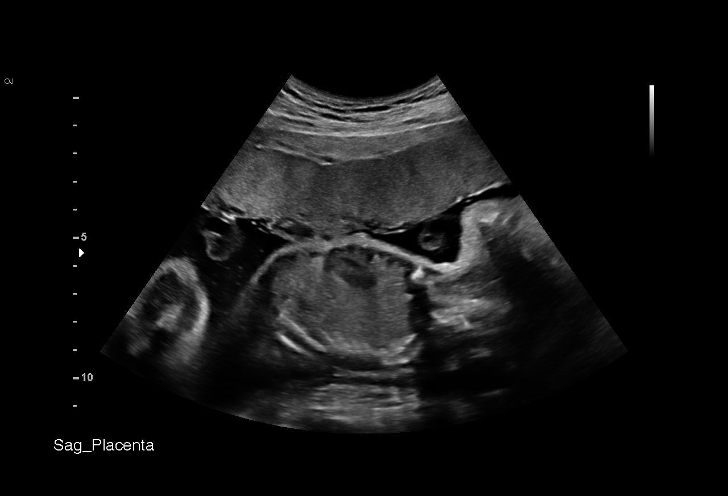
[im 7/28]
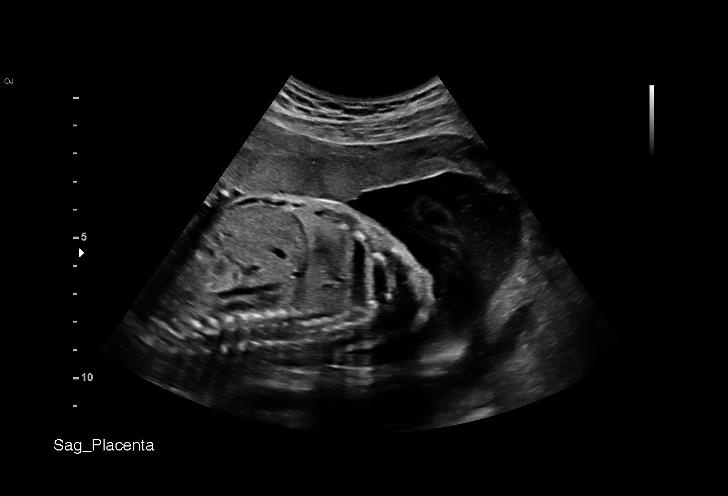
[im 9/28]
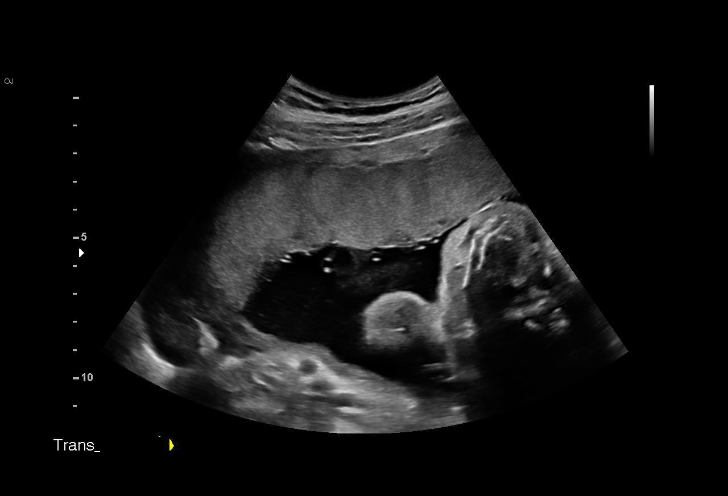
[im 11/28]
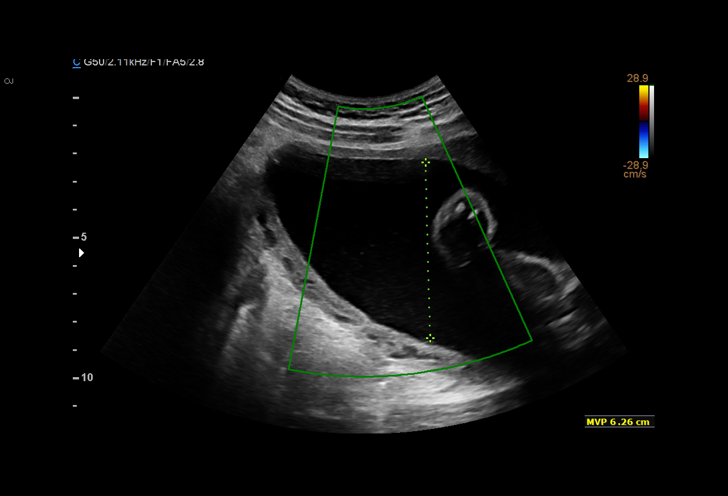
[im 13/28]
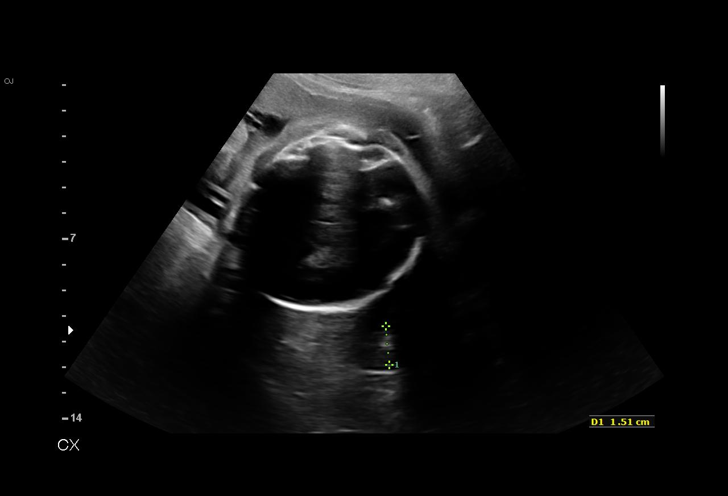
[im 15/28]
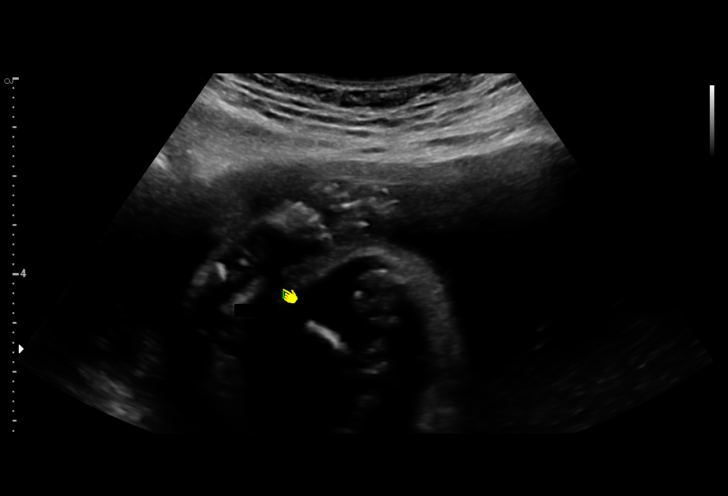
[im 16/28]
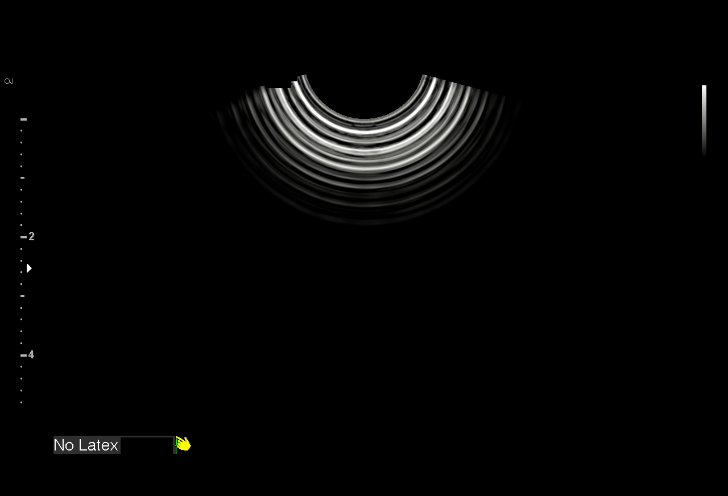
[im 18/28]
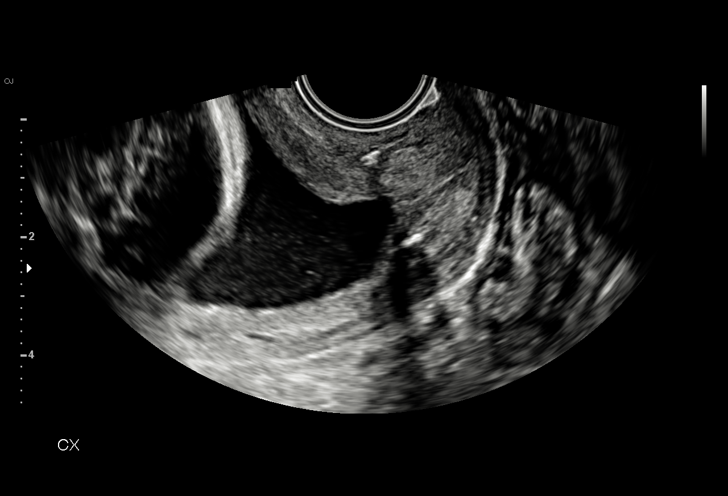
[im 20/28]
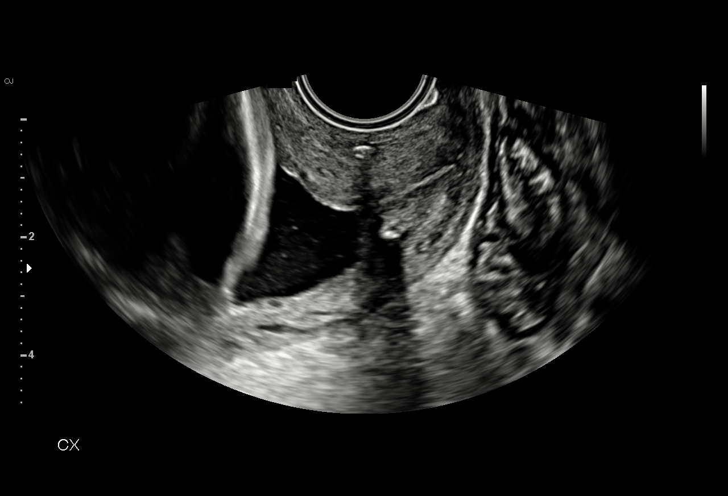
[im 22/28]
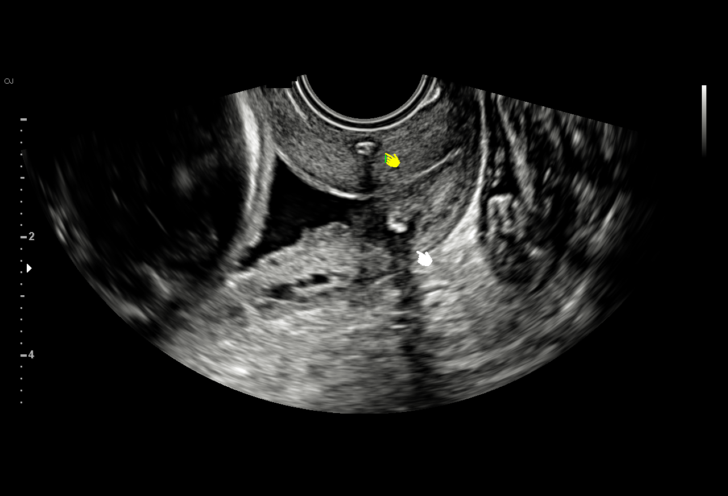
[im 24/28]
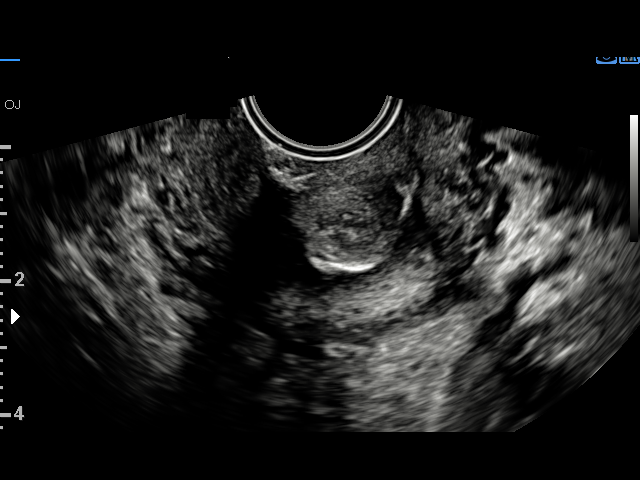
[im 26/28]
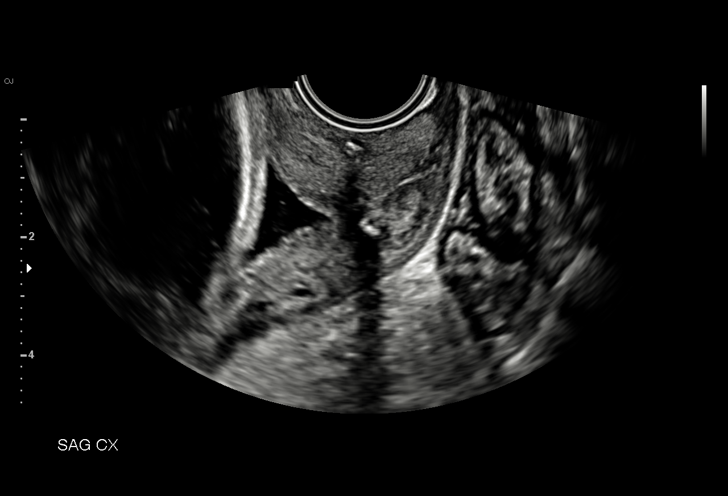
[im 28/28]
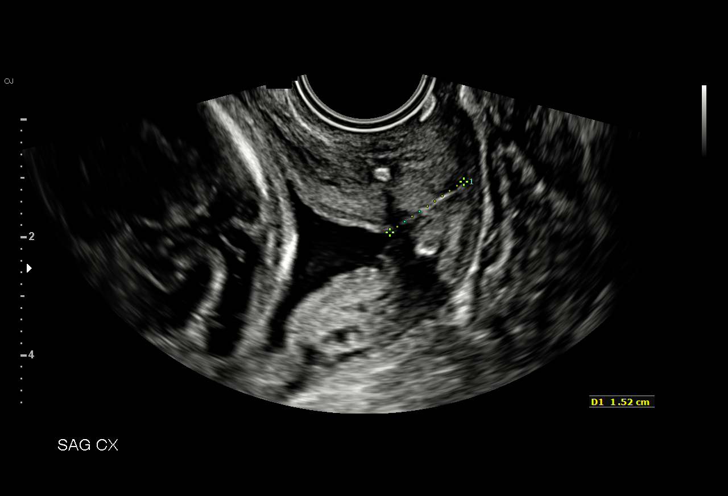

[15 of 28 positions shown; findings below may reference images not displayed]

[facility] [HOSP[facility]]

1  JIREH GRUBER          [id number]      [id number]     [id number]
Indications

25 weeks gestation of pregnancy
Cervical shortening complicating pregnancy
(rescue cerclage 06/13/17)
Encounter for cervical length
OB History

Blood Type:            Height:  5'     Weight (lb):  121       BMI:
Gravidity:    2         Term:   0        Prem:   0        SAB:   0
TOP:          1       Ectopic:  0        Living: 0
Fetal Evaluation

Num Of Fetuses:     1
Fetal Heart         151
Rate(bpm):
Cardiac Activity:   Observed
Presentation:       Cephalic
Placenta:           Anterior, above cervical os

Amniotic Fluid
AFI FV:      Subjectively within normal limits

Largest Pocket(cm)
6.26
Gestational Age

LMP:           26w 6d        Date:  01/29/17                 EDD:   11/05/17
Best:          25w 5d     Det. By:  U/S  (06/11/17)          EDD:   11/13/17
Cervix Uterus Adnexa
Cervix
Length:            1.5  cm.
Normal appearance by transvaginal scan. cerclage visualized.
Impression

Intrauterine pregnancy at 25+5 weeks with cerclage in situ
here for transvaginal evaluation
Normal fetal cardiac activity
Presentation is cephalic
Transvaginal cervical length is 15 mm without funnelling or
changes with transfundal pressure, unchanged from 2 weeks
ago
Recommendations

No further TVCL are indicated past 26 weeks
Follow-up ultrasounds as clinically indicated.
